# Patient Record
Sex: Female | Born: 1970 | Race: White | Hispanic: No | Marital: Married | Smoking: Former smoker
Health system: Southern US, Community
[De-identification: ages and names within clinical notes are randomized; demographics above are authoritative.]

## PROBLEM LIST (undated history)

## (undated) DIAGNOSIS — F308 Other manic episodes: Secondary | ICD-10-CM

## (undated) DIAGNOSIS — F1020 Alcohol dependence, uncomplicated: Secondary | ICD-10-CM

## (undated) DIAGNOSIS — F909 Attention-deficit hyperactivity disorder, unspecified type: Secondary | ICD-10-CM

## (undated) DIAGNOSIS — Z1509 Genetic susceptibility to other malignant neoplasm: Secondary | ICD-10-CM

## (undated) DIAGNOSIS — F329 Major depressive disorder, single episode, unspecified: Secondary | ICD-10-CM

## (undated) DIAGNOSIS — F419 Anxiety disorder, unspecified: Secondary | ICD-10-CM

## (undated) DIAGNOSIS — F32A Depression, unspecified: Secondary | ICD-10-CM

## (undated) HISTORY — DX: Genetic susceptibility to other malignant neoplasm: Z15.09

## (undated) HISTORY — DX: Other manic episodes: F30.8

---

## 2001-05-30 ENCOUNTER — Other Ambulatory Visit: Admission: RE | Admit: 2001-05-30 | Discharge: 2001-05-30 | Payer: Self-pay | Admitting: Obstetrics and Gynecology

## 2002-03-06 ENCOUNTER — Inpatient Hospital Stay (HOSPITAL_COMMUNITY): Admission: AD | Admit: 2002-03-06 | Discharge: 2002-03-09 | Payer: Self-pay | Admitting: Obstetrics & Gynecology

## 2002-07-23 ENCOUNTER — Other Ambulatory Visit: Admission: RE | Admit: 2002-07-23 | Discharge: 2002-07-23 | Payer: Self-pay | Admitting: Obstetrics & Gynecology

## 2003-05-26 ENCOUNTER — Inpatient Hospital Stay (HOSPITAL_COMMUNITY): Admission: EM | Admit: 2003-05-26 | Discharge: 2003-05-29 | Payer: Self-pay | Admitting: Psychiatry

## 2004-06-15 ENCOUNTER — Other Ambulatory Visit: Admission: RE | Admit: 2004-06-15 | Discharge: 2004-06-15 | Payer: Self-pay | Admitting: Obstetrics & Gynecology

## 2005-06-27 ENCOUNTER — Other Ambulatory Visit: Admission: RE | Admit: 2005-06-27 | Discharge: 2005-06-27 | Payer: Self-pay | Admitting: Obstetrics & Gynecology

## 2010-10-18 ENCOUNTER — Encounter: Payer: Self-pay | Admitting: Orthopedic Surgery

## 2011-04-19 ENCOUNTER — Encounter: Payer: Self-pay | Admitting: Sports Medicine

## 2011-04-20 ENCOUNTER — Encounter: Payer: Self-pay | Admitting: Sports Medicine

## 2012-09-05 ENCOUNTER — Other Ambulatory Visit: Payer: Self-pay | Admitting: Obstetrics & Gynecology

## 2012-09-05 DIAGNOSIS — N63 Unspecified lump in unspecified breast: Secondary | ICD-10-CM

## 2012-09-17 ENCOUNTER — Ambulatory Visit
Admission: RE | Admit: 2012-09-17 | Discharge: 2012-09-17 | Disposition: A | Discharge status: Home / Self Care | Payer: BC Managed Care – PPO | Source: Ambulatory Visit | Attending: Obstetrics & Gynecology | Admitting: Obstetrics & Gynecology

## 2012-09-17 DIAGNOSIS — N63 Unspecified lump in unspecified breast: Secondary | ICD-10-CM

## 2014-05-31 IMAGING — MG MM DIGITAL DIAGNOSTIC BILAT
5 series · 5 of 5 positions shown · non-contrast
Comparison: None

CLINICAL DATA: 41-year-old female for bilateral mammograms and
with palpable thickening in the outer right breast.

DIGITAL DIAGNOSTIC BILATERAL MAMMOGRAM WITH CAD AND RIGHT BREAST
ULTRASOUND:

[R CC]
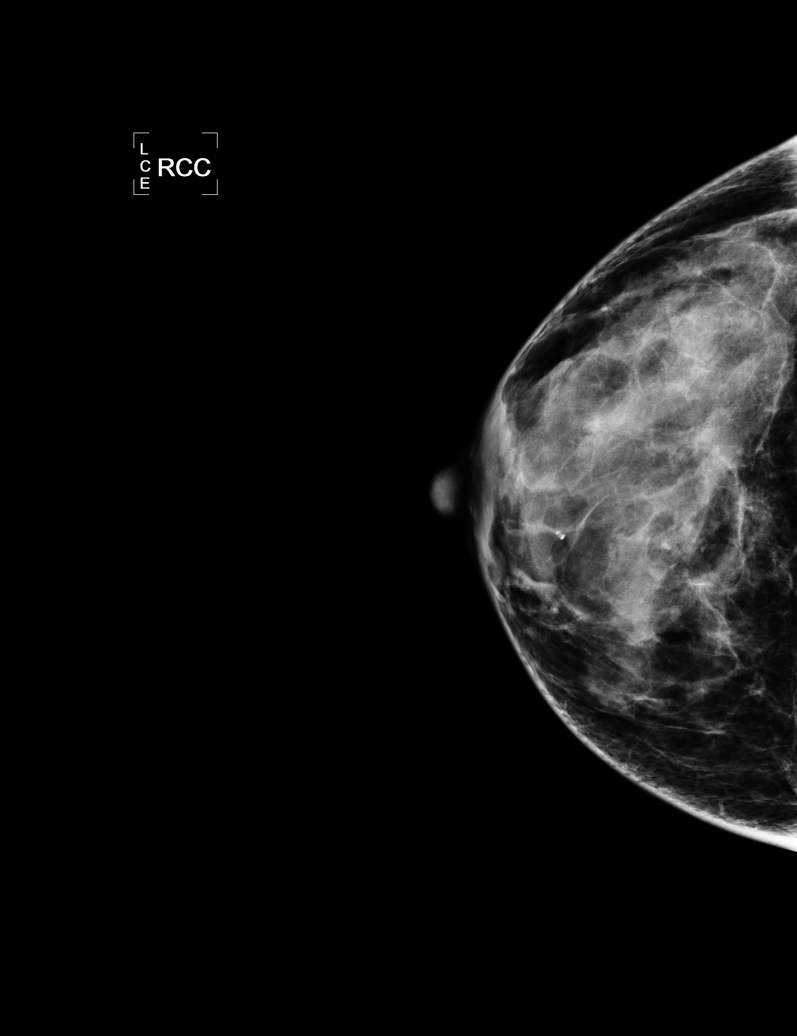

[L CC]
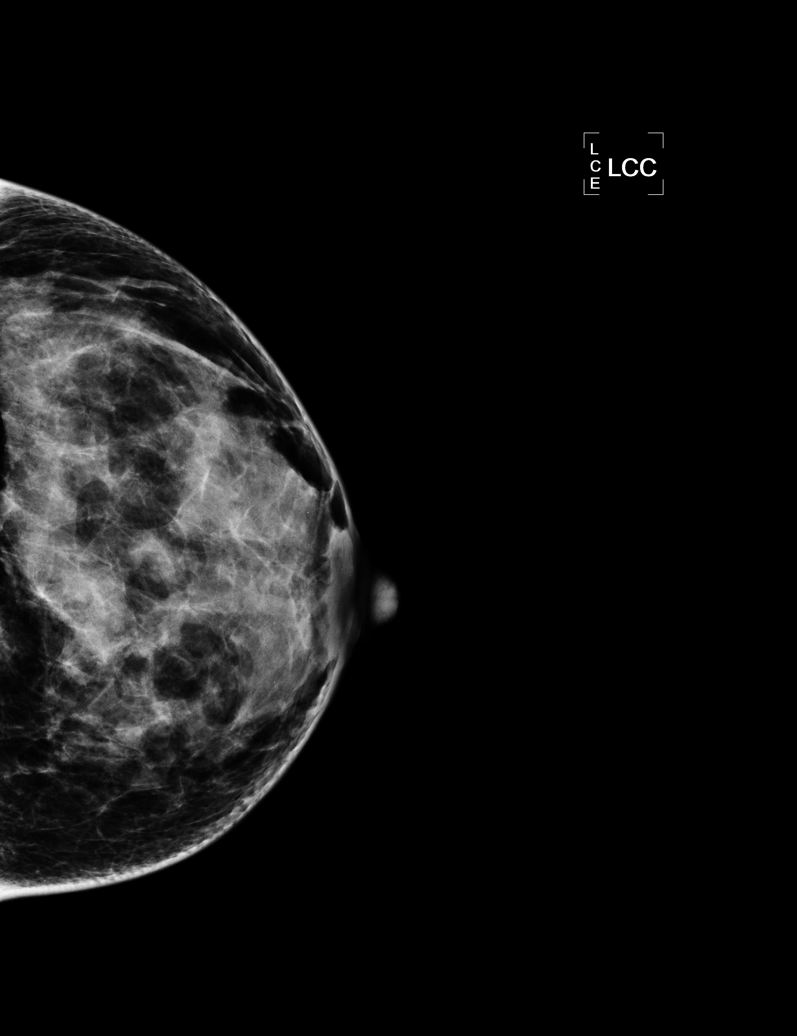

[L MLO (1 of 2)]
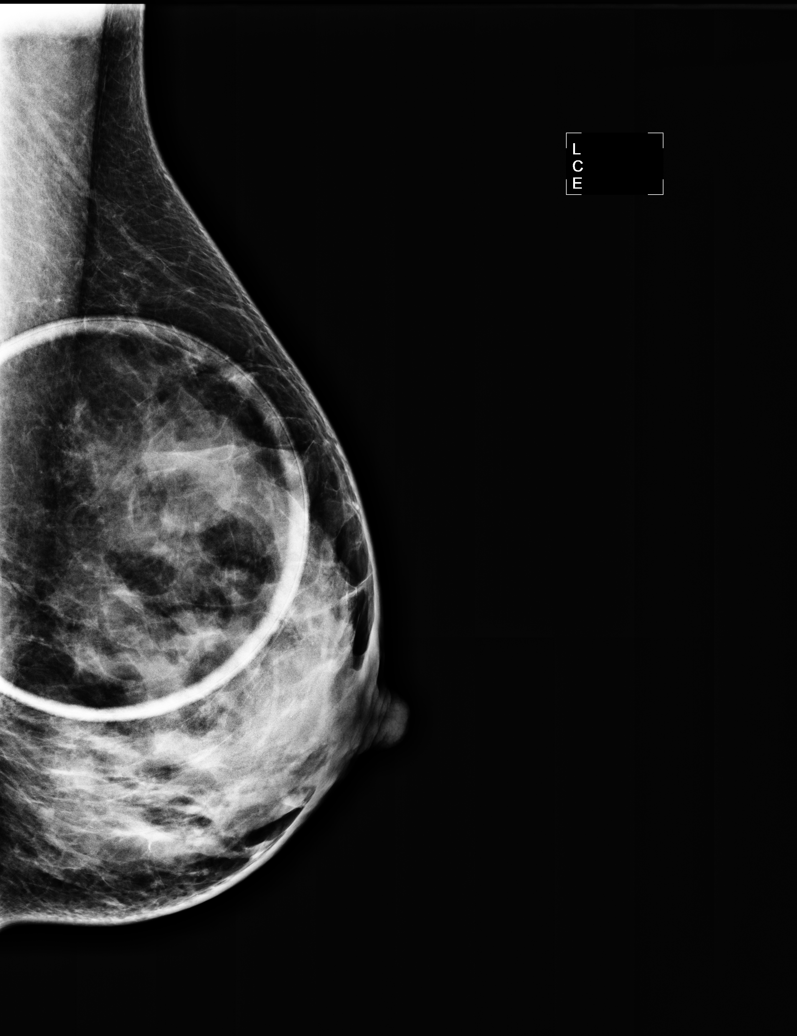

[L MLO (2 of 2)]
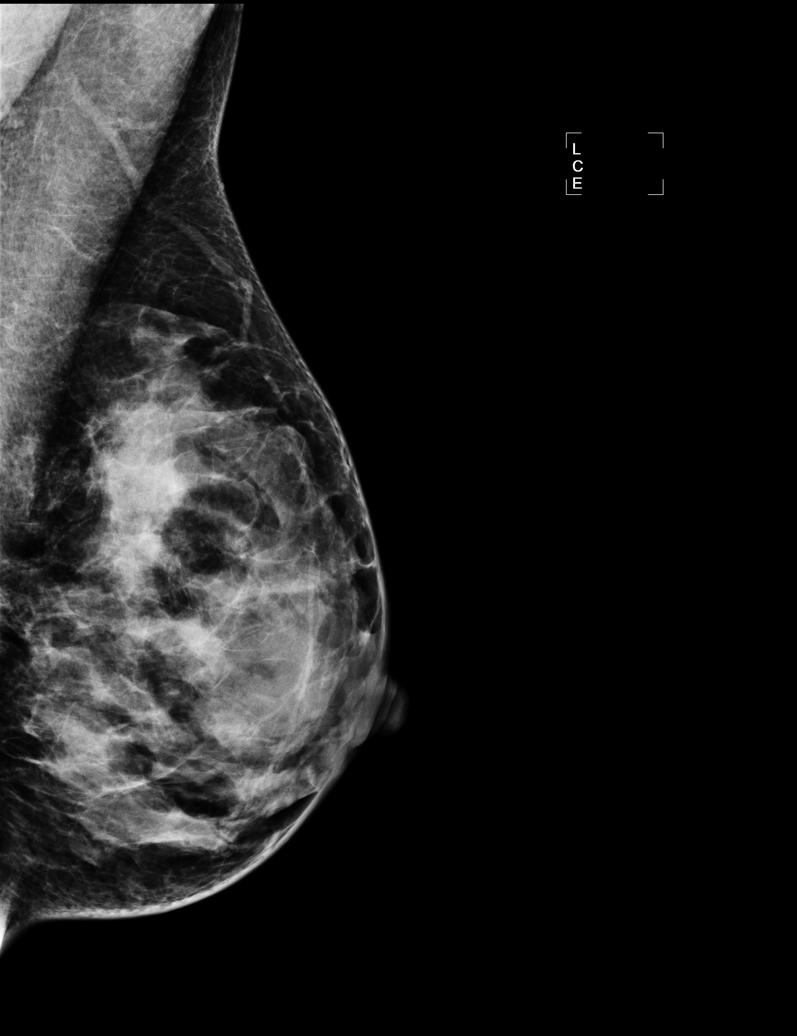

[R MLO]
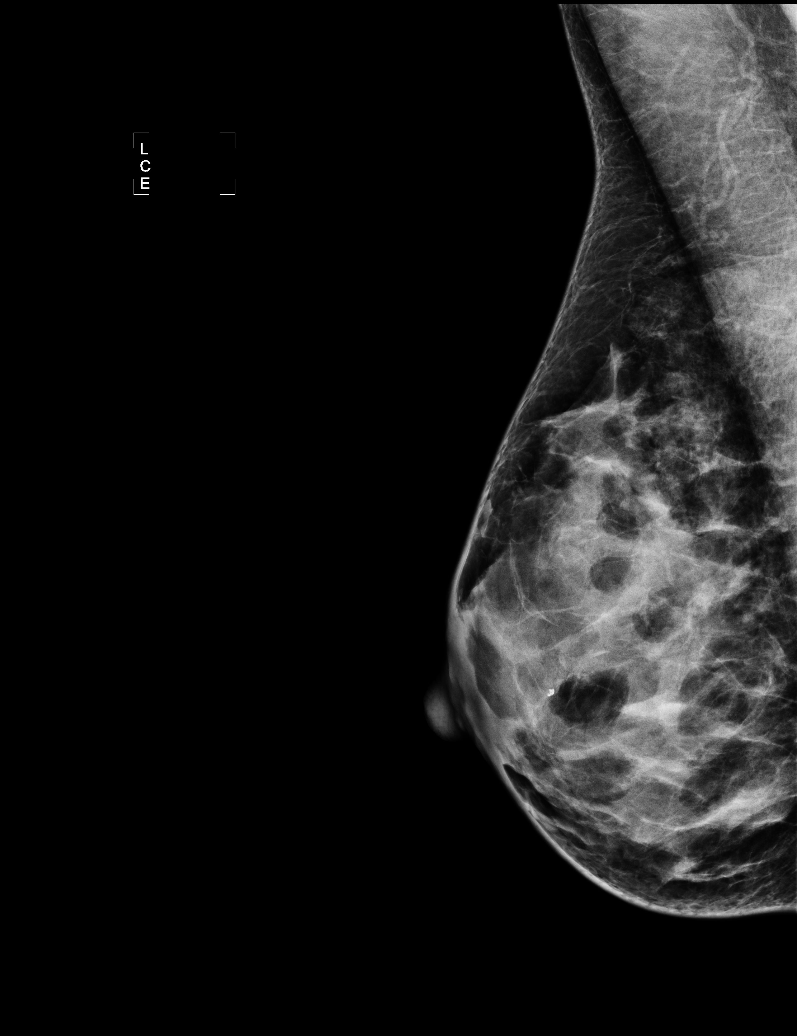

[5 of 5 positions shown; findings below may reference images not displayed]

FINDINGS: ACR Breast Density Category 3: The breast tissue is heterogeneously
dense.

A spot compression view of the left breast and routine views of
both breasts demonstrate no evidence of discrete mass, distortion
or worrisome calcifications bilaterally.

Mammographic images were processed with CAD.

On physical exam, mild thickening is identified within the outer
right breast, in the area of palpable concern.

Ultrasound is performed, showing no evidence of solid or cystic
mass, distortion or abnormal areas of shadowing within the entire
outer right breast.  Normal dense fibroglandular tissue is
identified in the area of palpable concern.
IMPRESSION: No mammographic, palpable or sonographic abnormality identified in
the area of palpable concern.

No mammographic evidence of breast malignancy bilaterally.

BI-RADS CATEGORY 1:  Negative.

RECOMMENDATION:
Bilateral screening mammograms in 1 year.

I discussed the findings and recommendations with the patient and
her questions answered.  She was encouraged to begin/continue
monthly self exams and to contact her primary physician if any
changes noted. A written report was given to the patient.

## 2015-05-25 ENCOUNTER — Ambulatory Visit: Payer: Self-pay

## 2016-02-10 DIAGNOSIS — F901 Attention-deficit hyperactivity disorder, predominantly hyperactive type: Secondary | ICD-10-CM | POA: Diagnosis not present

## 2016-02-10 DIAGNOSIS — F39 Unspecified mood [affective] disorder: Secondary | ICD-10-CM | POA: Diagnosis not present

## 2016-03-10 DIAGNOSIS — F411 Generalized anxiety disorder: Secondary | ICD-10-CM | POA: Diagnosis not present

## 2016-03-17 DIAGNOSIS — F411 Generalized anxiety disorder: Secondary | ICD-10-CM | POA: Diagnosis not present

## 2016-04-27 ENCOUNTER — Encounter (HOSPITAL_COMMUNITY): Payer: Self-pay

## 2016-04-27 ENCOUNTER — Emergency Department (HOSPITAL_COMMUNITY)
Admission: EM | Admit: 2016-04-27 | Discharge: 2016-04-27 | Disposition: A | Discharge status: Home / Self Care | Payer: BLUE CROSS/BLUE SHIELD | Attending: Emergency Medicine | Admitting: Emergency Medicine

## 2016-04-27 DIAGNOSIS — R05 Cough: Secondary | ICD-10-CM | POA: Diagnosis present

## 2016-04-27 DIAGNOSIS — R7401 Elevation of levels of liver transaminase levels: Secondary | ICD-10-CM | POA: Insufficient documentation

## 2016-04-27 DIAGNOSIS — F1310 Sedative, hypnotic or anxiolytic abuse, uncomplicated: Secondary | ICD-10-CM | POA: Insufficient documentation

## 2016-04-27 DIAGNOSIS — F329 Major depressive disorder, single episode, unspecified: Secondary | ICD-10-CM | POA: Insufficient documentation

## 2016-04-27 DIAGNOSIS — F909 Attention-deficit hyperactivity disorder, unspecified type: Secondary | ICD-10-CM | POA: Diagnosis not present

## 2016-04-27 DIAGNOSIS — Z87891 Personal history of nicotine dependence: Secondary | ICD-10-CM | POA: Diagnosis not present

## 2016-04-27 DIAGNOSIS — D7589 Other specified diseases of blood and blood-forming organs: Secondary | ICD-10-CM | POA: Insufficient documentation

## 2016-04-27 DIAGNOSIS — F1010 Alcohol abuse, uncomplicated: Secondary | ICD-10-CM

## 2016-04-27 DIAGNOSIS — R059 Cough, unspecified: Secondary | ICD-10-CM

## 2016-04-27 HISTORY — DX: Major depressive disorder, single episode, unspecified: F32.9

## 2016-04-27 HISTORY — DX: Depression, unspecified: F32.A

## 2016-04-27 HISTORY — DX: Anxiety disorder, unspecified: F41.9

## 2016-04-27 HISTORY — DX: Attention-deficit hyperactivity disorder, unspecified type: F90.9

## 2016-04-27 HISTORY — DX: Alcohol dependence, uncomplicated: F10.20

## 2016-04-27 MED ORDER — CHLORDIAZEPOXIDE HCL 25 MG PO CAPS: ORAL_CAPSULE | ORAL | 0 refills | Status: DC

## 2016-04-27 NOTE — Discharge Instructions (Signed)
Please read and follow all provided instructions.  Your diagnoses today include:  1. Alcohol abuse   2. Cough     Tests performed today include: Vital signs. See below for your results today.   Medications prescribed:  Librium - medication to prevent serious symptoms from alcohol withdrawal  Home care instructions:  Follow any educational materials contained in this packet.  Follow-up instructions: Please follow-up with your primary care provider as needed for further evaluation of your symptoms.  Return instructions:  Please return to the Emergency Department if you experience worsening symptoms.  Please return if you have any other emergent concerns.  Additional Information:  Your vital signs today were: BP 115/76    Pulse 87    Temp 98.3 F (36.8 C) (Oral)    Resp 16    LMP 04/27/2016    SpO2 100%  If your blood pressure (BP) was elevated above 135/85 this visit, please have this repeated by your doctor within one month. ---------------   Substance Abuse Treatment Programs  Intensive Outpatient Programs [facility]     [address]      [place], [place]                   [phone]       [facility] [address] [place] [address] [place], [place] [phone]  [facility]     (Inpatient and outpatient)     [address]           [phone]    [facility] [phone] (Suboxone and Methadone)  [address]      [place], [place] [address]      [phone]       [address] [place], [place] [phone]  [facility] (Outpatient/Inpatient, [facility])    (insurance only) [phone]             [facility] (Groups & Residential) [place], [place] [phone]     [facility]     [address]     [place], [place]      [phone]       [facility] (for caregivers and family) [phone] [place],  [place] [phone]      Residential Treatment Programs [facility]      [address] [place], [place] [address]  [phone]       [facility] [address] [place], [place] [address] [phone]  [facility] [place]        [phone]       [facility] [phone]  [facility]             [address]                                         [place], [place]                                                [phone] or [phone]                               [facility] [place] [address] [place]  [address] [place], [address] [phone]  [facility]    [address]      [place], [place]     [phone]       [facility] [address] [place], [place] [phone]  [facility]   [address] [place], [place] [address]     [phone]      Admissions: 8am-3pm M-F  [facility] ([facility]) [address] [place], [place] [phone]  [facility]: Residential Program [phone] Days)   [place], [place]      [phone] or [phone]     [facility]: [facility] [place], [place] (Walk in Hours over the weekend or by referral)  [facility] [address] [place], [place], [place] [address] [phone]  Crisis Mobile: [facility]:  [phone] (for crisis response 24 hours a day) [facility] Hotline:      7241132598 Outpatient Psychiatry and Counseling  [facility]: Mobile Crisis Management 24 hours:  [phone]  [facility] of [facility] [employer] sliding scale fee and walk in schedule: M-F 8am-12pm/1pm-3pm [address]  [place], [place] [address] [phone]  [facility] [address] [place], [place] [address] [phone]  [facility] ([facility] as [facility] [employer])- new patient walk-in appointments available Monday - Friday 8am -3pm.          [address] [place], [place] [address] [phone] or crisis line- [phone]  [facility]/ Intensive Outpatient Therapy Program [address] [place], [place] [address] [phone]  [facility]                  Crisis Services      [phone] [address]     [place], [place] [address]                 [facility]   [facility] [phone]. [address] [place], [place] [address]   [employer] of Care          [address]  [place], [place] [address]       [phone]  [facility] [address] [place], [place] [address] [phone]  [facility]    [address]    [place], [place] [address]     [phone]       Andee Poles, MD     [address]     [place] [address]     [phone]       [facility] [address] [place] [address]  [facility]     [address] [place], [place]      [phone]       [facility] Eulogio Ditch, MD [address] [place], [place] [address] [phone]  [facility]     [address]     [place], [place] [address]     [phone]       [facility] [address] [place], [place] [address] [phone] Resources for Temporary Residential Assistance/Crisis Centers  [facility] CENT[facility]) M-F 8am-3pm   [address] [place], [place] [address]   [phone] Services include: laundry, barbering, support groups, case management, phone  & computer access, showers, AA/NA  mtgs, mental health/substance abuse nurse, job skills class, disability information, VA assistance, spiritual classes, etc.   HOMELESS SHELTERS  [facility]     [facility]   [address], [place]      [phone]              [employer] (women and children)       [address] [place], [place] [address] [phone] [email or web address] for application and process Application Required  [facility]   [address]    [place] [address]     [phone]                    [facility] [place] [address] [place], [place] [address] [phone] application appt.) Application Required  [facility] (women only)    [address]     [place], [place] [address]     [phone]      Intake starts 6pm daily Need valid ID, SSC, & Police report [employer] [address] [place], [place] [phone] Application Required  [employer] (men only)     [address]      [place], [place]     [phone]       [facility] [place] (Pregnant women only) [address] [place], [place] [phone]  [facility]      [address]      [place], [place] [address]     [phone]             [facility] [address] [place], [place] [phone] 90 day commitment/SA/Application process  [facility](men only)     [address]     [place], [place]     [phone]       Check-in at [facility] of [facility] [address] [place], [place] [address] [phone] Men/Women/Women and Children must be there by 7 pm  [facility] [place], [place] [phone]

## 2016-04-27 NOTE — ED Provider Notes (Signed)
[facility]-EMERGENCY DEPT Provider Note   CSN: [id number] Arrival date & time: 04/27/16  1115  First Provider Contact:  First MD Initiated Contact with Patient 04/27/16 1142        History   Chief Complaint Chief Complaint  Patient presents with  . Cough  . ETOH Detox    HPI CATISHA CURCURU is a 45 y.o. female.  Patient presents with primary complaint of alcohol detox. Patient states that she stopped drinking yesterday and is requesting help staying off of alcohol. She has had a history of withdrawal seizures in the past, and no recent seizures. She took a benzodiazepine this morning prior to arrival. No other complaints regarding withdrawals. Patient also mentions a cough for the past 10 days. Cough is productive of yellow sputum. It is intermittent. Husband recently had bronchitis. No difficulty breathing, shortness of breath, or chest pain. No associated fevers. No treatments prior to arrival for this.      Past Medical History:  Diagnosis Date  . ADHD (attention deficit hyperactivity disorder)   . Alcoholism (HCC)   . Anxiety   . Depression     There are no active problems to display for this patient.   Past Surgical History:  Procedure Laterality Date  . CESAREAN SECTION      OB History    No data available       Home Medications    Prior to Admission medications   Medication Sig Start Date End Date Taking? Authorizing Provider  chlordiazePOXIDE (LIBRIUM) 25 MG capsule 50mg  PO TID x 1D, then 25-50mg  PO BID X 1D, then 25-50mg  PO QD X 1D 04/27/16   Renne Crigler, PA-C    Family History History reviewed. No pertinent family history.  Social History Social History  Substance Use Topics  . Smoking status: Former Games developer  . Smokeless tobacco: Never Used     Comment: Pt wears nicotine patch daily  . Alcohol use Yes     Allergies   Review of patient's allergies indicates not on file.   Review of Systems Review of Systems  Constitutional: Negative for  fever.  HENT: Negative for rhinorrhea and sore throat.   Eyes: Negative for redness.  Respiratory: Positive for cough.   Cardiovascular: Negative for chest pain.  Gastrointestinal: Negative for abdominal pain, diarrhea, nausea and vomiting.  Genitourinary: Negative for dysuria.  Musculoskeletal: Negative for myalgias.  Skin: Negative for rash.  Neurological: Negative for seizures and headaches.  Psychiatric/Behavioral: Negative for agitation, behavioral problems, self-injury and suicidal ideas. The patient is nervous/anxious. The patient is not hyperactive.      Physical Exam Updated Vital Signs BP 115/76   Pulse 87   Temp 98.3 F (36.8 C) (Oral)   Resp 16   LMP 04/27/2016   SpO2 100%   Physical Exam  Constitutional: She appears well-developed and well-nourished.  HENT:  Head: Normocephalic and atraumatic.  Eyes: Conjunctivae are normal. Right eye exhibits no discharge. Left eye exhibits no discharge.  Neck: Normal range of motion. Neck supple.  Cardiovascular: Normal rate, regular rhythm and normal heart sounds.   Pulmonary/Chest: Effort normal and breath sounds normal. No respiratory distress. She has no wheezes. She has no rales.  Abdominal: Soft. There is no tenderness.  Neurological: She is alert.  Skin: Skin is warm and dry.  Psychiatric: She has a normal mood and affect.  Nursing note and vitals reviewed.    ED Treatments / Results  Labs (all labs ordered are listed, but only abnormal results are  displayed) Labs Reviewed - No data to display  EKG  EKG Interpretation None       Radiology No results found.  Procedures Procedures (including critical care time)  Medications Ordered in ED Medications - No data to display   Initial Impression / Assessment and Plan / ED Course  I have reviewed the triage vital signs and the nursing notes.  Pertinent labs & imaging results that were available during my care of the patient were reviewed by me and  considered in my medical decision making (see chart for details).  Clinical Course   Patient seen and examined. X-ray ordered. Discussed procedure for outpatient referrals and Librium. Encourage patient to follow-up with outpatient referrals for possible inpatient versus outpatient treatment. Chest x-ray ordered.  Vital signs reviewed and are as follows: Vitals:   04/27/16 1140 04/27/16 1205  BP: 120/80 115/76  Pulse: 74 87  Resp:  16  Temp:     12:39 PM Patient now refusing chest x-ray, requesting to leave. Will proceed with alcohol detox treatment as above.  Patient urged to return with worsening symptoms or other concerns. Patient verbalized understanding and agrees with plan.    Final Clinical Impressions(s) / ED Diagnoses   Final diagnoses:  Alcohol abuse  Cough    New Prescriptions New Prescriptions   CHLORDIAZEPOXIDE (LIBRIUM) 25 MG CAPSULE     PO TID x 1D, then 25-50mg  PO BID X 1D, then 25-50mg  PO QD X 1D     Renne Crigler, PA-C 04/27/16 1239    Azalia Bilis, MD 04/29/16 [phone]

## 2016-04-27 NOTE — ED Notes (Signed)
Pt requesting to leave.  PA made aware.

## 2016-04-27 NOTE — ED Triage Notes (Addendum)
Pt c/o productive cough x 10 days and ETOH detox.  Denies pain. Pt reports yellow sputum.  Pt has not been taking anything for cough.  Sts last drink around 0100.  Pt reports taking benzo prior to arrival.  Sts she would also like to "get off" her ADHD medication.  Unknown if benzo is prescribed to the Pt.     Dry cough noted during Triage.  Lungs sounds clear.

## 2016-05-11 DIAGNOSIS — R05 Cough: Secondary | ICD-10-CM | POA: Diagnosis not present

## 2016-05-11 DIAGNOSIS — F39 Unspecified mood [affective] disorder: Secondary | ICD-10-CM | POA: Diagnosis not present

## 2016-05-11 DIAGNOSIS — F901 Attention-deficit hyperactivity disorder, predominantly hyperactive type: Secondary | ICD-10-CM | POA: Diagnosis not present

## 2016-06-16 DIAGNOSIS — Z0000 Encounter for general adult medical examination without abnormal findings: Secondary | ICD-10-CM | POA: Diagnosis not present

## 2016-07-14 DIAGNOSIS — M50222 Other cervical disc displacement at C5-C6 level: Secondary | ICD-10-CM | POA: Diagnosis not present

## 2016-08-17 DIAGNOSIS — M542 Cervicalgia: Secondary | ICD-10-CM | POA: Diagnosis not present

## 2016-08-17 DIAGNOSIS — M461 Sacroiliitis, not elsewhere classified: Secondary | ICD-10-CM | POA: Diagnosis not present

## 2016-08-17 DIAGNOSIS — M545 Low back pain: Secondary | ICD-10-CM | POA: Diagnosis not present

## 2016-09-12 DIAGNOSIS — M542 Cervicalgia: Secondary | ICD-10-CM | POA: Diagnosis not present

## 2016-09-12 DIAGNOSIS — M533 Sacrococcygeal disorders, not elsewhere classified: Secondary | ICD-10-CM | POA: Diagnosis not present

## 2016-09-12 DIAGNOSIS — M791 Myalgia: Secondary | ICD-10-CM | POA: Diagnosis not present

## 2016-10-19 DIAGNOSIS — F39 Unspecified mood [affective] disorder: Secondary | ICD-10-CM | POA: Diagnosis not present

## 2016-10-19 DIAGNOSIS — F901 Attention-deficit hyperactivity disorder, predominantly hyperactive type: Secondary | ICD-10-CM | POA: Diagnosis not present

## 2016-10-19 DIAGNOSIS — F1010 Alcohol abuse, uncomplicated: Secondary | ICD-10-CM | POA: Diagnosis not present

## 2016-11-07 DIAGNOSIS — M5481 Occipital neuralgia: Secondary | ICD-10-CM | POA: Diagnosis not present

## 2016-11-07 DIAGNOSIS — M533 Sacrococcygeal disorders, not elsewhere classified: Secondary | ICD-10-CM | POA: Diagnosis not present

## 2016-11-07 DIAGNOSIS — M791 Myalgia: Secondary | ICD-10-CM | POA: Diagnosis not present

## 2016-11-07 DIAGNOSIS — M542 Cervicalgia: Secondary | ICD-10-CM | POA: Diagnosis not present

## 2016-11-09 DIAGNOSIS — Z3200 Encounter for pregnancy test, result unknown: Secondary | ICD-10-CM | POA: Diagnosis not present

## 2016-11-09 DIAGNOSIS — Z30430 Encounter for insertion of intrauterine contraceptive device: Secondary | ICD-10-CM | POA: Diagnosis not present

## 2016-11-24 DIAGNOSIS — N899 Noninflammatory disorder of vagina, unspecified: Secondary | ICD-10-CM | POA: Diagnosis not present

## 2016-11-24 DIAGNOSIS — T192XXA Foreign body in vulva and vagina, initial encounter: Secondary | ICD-10-CM | POA: Diagnosis not present

## 2016-11-25 DIAGNOSIS — M461 Sacroiliitis, not elsewhere classified: Secondary | ICD-10-CM | POA: Diagnosis not present

## 2016-11-25 DIAGNOSIS — M545 Low back pain: Secondary | ICD-10-CM | POA: Diagnosis not present

## 2016-11-25 DIAGNOSIS — M542 Cervicalgia: Secondary | ICD-10-CM | POA: Diagnosis not present

## 2016-11-30 DIAGNOSIS — Z1151 Encounter for screening for human papillomavirus (HPV): Secondary | ICD-10-CM | POA: Diagnosis not present

## 2016-11-30 DIAGNOSIS — Z01419 Encounter for gynecological examination (general) (routine) without abnormal findings: Secondary | ICD-10-CM | POA: Diagnosis not present

## 2016-11-30 DIAGNOSIS — Z6822 Body mass index (BMI) 22.0-22.9, adult: Secondary | ICD-10-CM | POA: Diagnosis not present

## 2016-11-30 DIAGNOSIS — Z1231 Encounter for screening mammogram for malignant neoplasm of breast: Secondary | ICD-10-CM | POA: Diagnosis not present

## 2016-12-08 DIAGNOSIS — D2272 Melanocytic nevi of left lower limb, including hip: Secondary | ICD-10-CM | POA: Diagnosis not present

## 2016-12-08 DIAGNOSIS — D2371 Other benign neoplasm of skin of right lower limb, including hip: Secondary | ICD-10-CM | POA: Diagnosis not present

## 2016-12-08 DIAGNOSIS — D225 Melanocytic nevi of trunk: Secondary | ICD-10-CM | POA: Diagnosis not present

## 2016-12-08 DIAGNOSIS — L700 Acne vulgaris: Secondary | ICD-10-CM | POA: Diagnosis not present

## 2016-12-15 DIAGNOSIS — M542 Cervicalgia: Secondary | ICD-10-CM | POA: Diagnosis not present

## 2016-12-15 DIAGNOSIS — M5481 Occipital neuralgia: Secondary | ICD-10-CM | POA: Diagnosis not present

## 2016-12-15 DIAGNOSIS — M791 Myalgia: Secondary | ICD-10-CM | POA: Diagnosis not present

## 2016-12-15 DIAGNOSIS — M533 Sacrococcygeal disorders, not elsewhere classified: Secondary | ICD-10-CM | POA: Diagnosis not present

## 2016-12-21 DIAGNOSIS — J302 Other seasonal allergic rhinitis: Secondary | ICD-10-CM | POA: Diagnosis not present

## 2016-12-21 DIAGNOSIS — M792 Neuralgia and neuritis, unspecified: Secondary | ICD-10-CM | POA: Diagnosis not present

## 2016-12-21 DIAGNOSIS — F909 Attention-deficit hyperactivity disorder, unspecified type: Secondary | ICD-10-CM | POA: Diagnosis not present

## 2016-12-21 DIAGNOSIS — F1010 Alcohol abuse, uncomplicated: Secondary | ICD-10-CM | POA: Diagnosis not present

## 2017-01-19 DIAGNOSIS — F39 Unspecified mood [affective] disorder: Secondary | ICD-10-CM | POA: Diagnosis not present

## 2017-01-19 DIAGNOSIS — F1010 Alcohol abuse, uncomplicated: Secondary | ICD-10-CM | POA: Diagnosis not present

## 2017-01-19 DIAGNOSIS — F901 Attention-deficit hyperactivity disorder, predominantly hyperactive type: Secondary | ICD-10-CM | POA: Diagnosis not present

## 2017-02-14 DIAGNOSIS — M542 Cervicalgia: Secondary | ICD-10-CM | POA: Diagnosis not present

## 2017-02-14 DIAGNOSIS — M5481 Occipital neuralgia: Secondary | ICD-10-CM | POA: Diagnosis not present

## 2017-02-14 DIAGNOSIS — M791 Myalgia: Secondary | ICD-10-CM | POA: Diagnosis not present

## 2017-02-14 DIAGNOSIS — M533 Sacrococcygeal disorders, not elsewhere classified: Secondary | ICD-10-CM | POA: Diagnosis not present

## 2017-03-15 DIAGNOSIS — R300 Dysuria: Secondary | ICD-10-CM | POA: Diagnosis not present

## 2017-03-15 DIAGNOSIS — R358 Other polyuria: Secondary | ICD-10-CM | POA: Diagnosis not present

## 2017-03-15 DIAGNOSIS — R8290 Unspecified abnormal findings in urine: Secondary | ICD-10-CM | POA: Diagnosis not present

## 2017-05-04 DIAGNOSIS — F310 Bipolar disorder, current episode hypomanic: Secondary | ICD-10-CM | POA: Diagnosis not present

## 2017-05-10 DIAGNOSIS — F331 Major depressive disorder, recurrent, moderate: Secondary | ICD-10-CM | POA: Diagnosis not present

## 2017-05-10 DIAGNOSIS — F900 Attention-deficit hyperactivity disorder, predominantly inattentive type: Secondary | ICD-10-CM | POA: Diagnosis not present

## 2017-05-10 DIAGNOSIS — F411 Generalized anxiety disorder: Secondary | ICD-10-CM | POA: Diagnosis not present

## 2017-06-28 DIAGNOSIS — F900 Attention-deficit hyperactivity disorder, predominantly inattentive type: Secondary | ICD-10-CM | POA: Diagnosis not present

## 2017-08-30 DIAGNOSIS — F900 Attention-deficit hyperactivity disorder, predominantly inattentive type: Secondary | ICD-10-CM | POA: Diagnosis not present

## 2017-10-25 DIAGNOSIS — F411 Generalized anxiety disorder: Secondary | ICD-10-CM | POA: Diagnosis not present

## 2017-10-25 DIAGNOSIS — F900 Attention-deficit hyperactivity disorder, predominantly inattentive type: Secondary | ICD-10-CM | POA: Diagnosis not present

## 2017-10-25 DIAGNOSIS — F331 Major depressive disorder, recurrent, moderate: Secondary | ICD-10-CM | POA: Diagnosis not present

## 2017-11-28 DIAGNOSIS — F331 Major depressive disorder, recurrent, moderate: Secondary | ICD-10-CM | POA: Diagnosis not present

## 2017-11-28 DIAGNOSIS — F411 Generalized anxiety disorder: Secondary | ICD-10-CM | POA: Diagnosis not present

## 2017-11-28 DIAGNOSIS — F900 Attention-deficit hyperactivity disorder, predominantly inattentive type: Secondary | ICD-10-CM | POA: Diagnosis not present

## 2017-12-20 DIAGNOSIS — Z0000 Encounter for general adult medical examination without abnormal findings: Secondary | ICD-10-CM | POA: Diagnosis not present

## 2017-12-20 DIAGNOSIS — R82998 Other abnormal findings in urine: Secondary | ICD-10-CM | POA: Diagnosis not present

## 2017-12-20 DIAGNOSIS — R5381 Other malaise: Secondary | ICD-10-CM | POA: Diagnosis not present

## 2017-12-26 DIAGNOSIS — F331 Major depressive disorder, recurrent, moderate: Secondary | ICD-10-CM | POA: Diagnosis not present

## 2017-12-26 DIAGNOSIS — F900 Attention-deficit hyperactivity disorder, predominantly inattentive type: Secondary | ICD-10-CM | POA: Diagnosis not present

## 2017-12-26 DIAGNOSIS — F411 Generalized anxiety disorder: Secondary | ICD-10-CM | POA: Diagnosis not present

## 2017-12-27 DIAGNOSIS — Z800 Family history of malignant neoplasm of digestive organs: Secondary | ICD-10-CM | POA: Diagnosis not present

## 2017-12-27 DIAGNOSIS — F308 Other manic episodes: Secondary | ICD-10-CM | POA: Diagnosis not present

## 2017-12-27 DIAGNOSIS — F909 Attention-deficit hyperactivity disorder, unspecified type: Secondary | ICD-10-CM | POA: Diagnosis not present

## 2017-12-27 DIAGNOSIS — Z1389 Encounter for screening for other disorder: Secondary | ICD-10-CM | POA: Diagnosis not present

## 2017-12-27 DIAGNOSIS — Z0000 Encounter for general adult medical examination without abnormal findings: Secondary | ICD-10-CM | POA: Diagnosis not present

## 2017-12-27 DIAGNOSIS — F1010 Alcohol abuse, uncomplicated: Secondary | ICD-10-CM | POA: Diagnosis not present

## 2017-12-28 ENCOUNTER — Other Ambulatory Visit: Payer: Self-pay | Admitting: Internal Medicine

## 2017-12-28 DIAGNOSIS — Z800 Family history of malignant neoplasm of digestive organs: Secondary | ICD-10-CM

## 2017-12-28 DIAGNOSIS — Z8049 Family history of malignant neoplasm of other genital organs: Secondary | ICD-10-CM

## 2017-12-28 DIAGNOSIS — Z803 Family history of malignant neoplasm of breast: Secondary | ICD-10-CM

## 2018-01-03 DIAGNOSIS — F411 Generalized anxiety disorder: Secondary | ICD-10-CM | POA: Diagnosis not present

## 2018-01-03 DIAGNOSIS — F331 Major depressive disorder, recurrent, moderate: Secondary | ICD-10-CM | POA: Diagnosis not present

## 2018-01-03 DIAGNOSIS — F900 Attention-deficit hyperactivity disorder, predominantly inattentive type: Secondary | ICD-10-CM | POA: Diagnosis not present

## 2018-01-08 ENCOUNTER — Encounter: Payer: Self-pay | Admitting: Gastroenterology

## 2018-01-11 ENCOUNTER — Ambulatory Visit
Admission: RE | Admit: 2018-01-11 | Discharge: 2018-01-11 | Disposition: A | Discharge status: Home / Self Care | Payer: BLUE CROSS/BLUE SHIELD | Source: Ambulatory Visit | Attending: Internal Medicine | Admitting: Internal Medicine

## 2018-01-11 DIAGNOSIS — Z8049 Family history of malignant neoplasm of other genital organs: Secondary | ICD-10-CM | POA: Diagnosis not present

## 2018-01-11 DIAGNOSIS — Z803 Family history of malignant neoplasm of breast: Secondary | ICD-10-CM

## 2018-01-11 DIAGNOSIS — Z800 Family history of malignant neoplasm of digestive organs: Secondary | ICD-10-CM

## 2018-01-17 DIAGNOSIS — Z1231 Encounter for screening mammogram for malignant neoplasm of breast: Secondary | ICD-10-CM | POA: Diagnosis not present

## 2018-01-17 DIAGNOSIS — Z6821 Body mass index (BMI) 21.0-21.9, adult: Secondary | ICD-10-CM | POA: Diagnosis not present

## 2018-01-17 DIAGNOSIS — Z01419 Encounter for gynecological examination (general) (routine) without abnormal findings: Secondary | ICD-10-CM | POA: Diagnosis not present

## 2018-01-22 DIAGNOSIS — F1010 Alcohol abuse, uncomplicated: Secondary | ICD-10-CM | POA: Diagnosis not present

## 2018-01-22 DIAGNOSIS — F900 Attention-deficit hyperactivity disorder, predominantly inattentive type: Secondary | ICD-10-CM | POA: Diagnosis not present

## 2018-01-22 DIAGNOSIS — F411 Generalized anxiety disorder: Secondary | ICD-10-CM | POA: Diagnosis not present

## 2018-01-22 DIAGNOSIS — F331 Major depressive disorder, recurrent, moderate: Secondary | ICD-10-CM | POA: Diagnosis not present

## 2018-02-05 DIAGNOSIS — Z79899 Other long term (current) drug therapy: Secondary | ICD-10-CM | POA: Diagnosis not present

## 2018-02-05 DIAGNOSIS — R41840 Attention and concentration deficit: Secondary | ICD-10-CM | POA: Diagnosis not present

## 2018-02-20 DIAGNOSIS — Z1509 Genetic susceptibility to other malignant neoplasm: Secondary | ICD-10-CM | POA: Diagnosis not present

## 2018-02-22 ENCOUNTER — Ambulatory Visit: Payer: BLUE CROSS/BLUE SHIELD | Admitting: Gastroenterology

## 2018-03-02 ENCOUNTER — Other Ambulatory Visit: Payer: Self-pay | Admitting: Obstetrics and Gynecology

## 2018-03-05 DIAGNOSIS — F902 Attention-deficit hyperactivity disorder, combined type: Secondary | ICD-10-CM | POA: Diagnosis not present

## 2018-03-05 DIAGNOSIS — Z79899 Other long term (current) drug therapy: Secondary | ICD-10-CM | POA: Diagnosis not present

## 2018-03-12 DIAGNOSIS — F411 Generalized anxiety disorder: Secondary | ICD-10-CM | POA: Diagnosis not present

## 2018-03-12 DIAGNOSIS — F900 Attention-deficit hyperactivity disorder, predominantly inattentive type: Secondary | ICD-10-CM | POA: Diagnosis not present

## 2018-03-12 DIAGNOSIS — F331 Major depressive disorder, recurrent, moderate: Secondary | ICD-10-CM | POA: Diagnosis not present

## 2018-03-13 NOTE — Patient Instructions (Addendum)
Nicole ScottLaura J Huang  03/13/2018   Your procedure is scheduled on: 03-21-18  [facility]    Report to Admitting at 11:00 AM    Call this number if you have problems the morning of surgery [phone]   Remember: Do not eat food or drink liquids :After Midnight. You may have a Clear Liquid Diet from Midnight until 7:00 AM. After 7:00 AM, nothing until after surgery.     CLEAR LIQUID DIET   Foods Allowed                                                                     Foods Excluded  Coffee and tea, regular and decaf                             liquids that you cannot  Plain Jell-O in any flavor                                             see through such as: Fruit ices (not with fruit pulp)                                     milk, soups, orange juice  Iced Popsicles                                    All solid food Carbonated beverages, regular and diet                                    Cranberry, grape and apple juices Sports drinks like Gatorade Lightly seasoned clear broth or consume(fat free) Sugar, honey syrup  Sample Menu Breakfast                                Lunch                                     Supper Cranberry juice                    Beef broth                            Chicken broth Jell-O                                     Grape juice  Apple juice Coffee or tea                        Jell-O                                      Popsicle                                                Coffee or tea                        Coffee or tea  _____________________________________________________________________     Take these medicines the morning of surgery with A SIP OF WATER: (Amphetamine-Dextroamphetamine) Adderall XR,  Bupropion (Wellbutrin-XL),  Buspirone (Buspar), and Gabapentin (Neurontin), and Mydayis                                You may not have any metal on your body including  hair pins and              piercings  Do not wear jewelry, make-up, lotions, powders or perfumes, deodorant             Do not wear nail polish.  Do not shave  48 hours prior to surgery.            Do not bring valuables to the hospital. [place] IS NOT             RESPONSIBLE   FOR VALUABLES.  Contacts, dentures or bridgework may not be worn into surgery.  Leave suitcase in the car. After surgery it may be brought to your room.      Special Instructions: N/A              Please read over the following fact sheets you were given: _____________________________________________________________________          [facility] - Preparing for Surgery Before surgery, you can play an important role.  Because skin is not sterile, your skin needs to be as free of germs as possible.  You can reduce the number of germs on your skin by washing with CHG (chlorahexidine gluconate) soap before surgery.  CHG is an antiseptic cleaner which kills germs and bonds with the skin to continue killing germs even after washing. Please DO NOT use if you have an allergy to CHG or antibacterial soaps.  If your skin becomes reddened/irritated stop using the CHG and inform your nurse when you arrive at Short Stay. Do not shave (including legs and underarms) for at least 48 hours prior to the first CHG shower.  You may shave your face/neck. Please follow these instructions carefully:  1.  Shower with CHG Soap the night before surgery and the  morning of Surgery.  2.  If you choose to wash your hair, wash your hair first as usual with your  normal  shampoo.  3.  After you shampoo, rinse your hair and body thoroughly to remove the  shampoo.                           4.  Use CHG as  you would any other liquid soap.  You can apply chg directly  to the skin and wash                       Gently with a scrungie or clean washcloth.  5.  Apply the CHG Soap to your body ONLY FROM THE NECK DOWN.   Do not use on face/ open                            Wound or open sores. Avoid contact with eyes, ears mouth and genitals (private parts).                       Wash face,  Genitals (private parts) with your normal soap.             6.  Wash thoroughly, paying special attention to the area where your surgery  will be performed.  7.  Thoroughly rinse your body with warm water from the neck down.  8.  DO NOT shower/wash with your normal soap after using and rinsing off  the CHG Soap.                9.  Pat yourself dry with a clean towel.            10.  Wear clean pajamas.            11.  Place clean sheets on your bed the night of your first shower and do not  sleep with pets. Day of Surgery : Do not apply any lotions/deodorants the morning of surgery.  Please wear clean clothes to the hospital/surgery center.  FAILURE TO FOLLOW THESE INSTRUCTIONS MAY RESULT IN THE CANCELLATION OF YOUR SURGERY PATIENT SIGNATURE_________________________________  NURSE SIGNATURE__________________________________  ________________________________________________________________________  WHAT IS A BLOOD TRANSFUSION? Blood Transfusion Information  A transfusion is the replacement of blood or some of its parts. Blood is made up of multiple cells which provide different functions.  Red blood cells carry oxygen and are used for blood loss replacement.  White blood cells fight against infection.  Platelets control bleeding.  Plasma helps clot blood.  Other blood products are available for specialized needs, such as hemophilia or other clotting disorders. BEFORE THE TRANSFUSION  Who gives blood for transfusions?   Healthy volunteers who are fully evaluated to make sure their blood is safe. This is blood bank blood. Transfusion therapy is the safest it has ever been in the practice of medicine. Before blood is taken from a donor, a complete history is taken to make sure that person has no history of diseases nor engages in risky social behavior  (examples are intravenous drug use or sexual activity with multiple partners). The donor's travel history is screened to minimize risk of transmitting infections, such as malaria. The donated blood is tested for signs of infectious diseases, such as HIV and hepatitis. The blood is then tested to be sure it is compatible with you in order to minimize the chance of a transfusion reaction. If you or a relative donates blood, this is often done in anticipation of surgery and is not appropriate for emergency situations. It takes many days to process the donated blood. RISKS AND COMPLICATIONS Although transfusion therapy is very safe and saves many lives, the main dangers of transfusion include:   Getting an infectious disease.  Developing a transfusion reaction. This is an allergic reaction  to something in the blood you were given. Every precaution is taken to prevent this. The decision to have a blood transfusion has been considered carefully by your caregiver before blood is given. Blood is not given unless the benefits outweigh the risks. AFTER THE TRANSFUSION  Right after receiving a blood transfusion, you will usually feel much better and more energetic. This is especially true if your red blood cells have gotten low (anemic). The transfusion raises the level of the red blood cells which carry oxygen, and this usually causes an energy increase.  The nurse administering the transfusion will monitor you carefully for complications. HOME CARE INSTRUCTIONS  No special instructions are needed after a transfusion. You may find your energy is better. Speak with your caregiver about any limitations on activity for underlying diseases you may have. SEEK MEDICAL CARE IF:   Your condition is not improving after your transfusion.  You develop redness or irritation at the intravenous (IV) site. SEEK IMMEDIATE MEDICAL CARE IF:  Any of the following symptoms occur over the next 12 hours:  Shaking  chills.  You have a temperature by mouth above 102 F (38.9 C), not controlled by medicine.  Chest, back, or muscle pain.  People around you feel you are not acting correctly or are confused.  Shortness of breath or difficulty breathing.  Dizziness and fainting.  You get a rash or develop hives.  You have a decrease in urine output.  Your urine turns a dark color or changes to pink, red, or brown. Any of the following symptoms occur over the next 10 days:  You have a temperature by mouth above 102 F (38.9 C), not controlled by medicine.  Shortness of breath.  Weakness after normal activity.  The white part of the eye turns yellow (jaundice).  You have a decrease in the amount of urine or are urinating less often.  Your urine turns a dark color or changes to pink, red, or brown. Document Released: 09/09/2000 Document Revised: 12/05/2011 Document Reviewed: 04/28/2008 [facility] Patient Information 2014 [place], [place].  _______________________________________________________________________

## 2018-03-14 ENCOUNTER — Other Ambulatory Visit: Payer: Self-pay

## 2018-03-14 ENCOUNTER — Encounter (HOSPITAL_COMMUNITY)
Admission: RE | Admit: 2018-03-14 | Discharge: 2018-03-14 | Disposition: A | Discharge status: Home / Self Care | Payer: BLUE CROSS/BLUE SHIELD | Source: Ambulatory Visit | Attending: Obstetrics and Gynecology | Admitting: Obstetrics and Gynecology

## 2018-03-14 ENCOUNTER — Encounter (HOSPITAL_COMMUNITY): Payer: Self-pay

## 2018-03-14 DIAGNOSIS — Z01812 Encounter for preprocedural laboratory examination: Secondary | ICD-10-CM | POA: Diagnosis not present

## 2018-03-14 LAB — BASIC METABOLIC PANEL
Anion gap: 6 (ref 5–15)
BUN: 11 mg/dL (ref 6–20)
CHLORIDE: 104 mmol/L (ref 101–111)
CO2: 30 mmol/L (ref 22–32)
Calcium: 9.3 mg/dL (ref 8.9–10.3)
Creatinine, Ser: 0.87 mg/dL (ref 0.44–1.00)
GFR calc Af Amer: >60 mL/min (ref >60)
GLUCOSE: 107 mg/dL — AB (ref 65–99)
Potassium: 4.7 mmol/L (ref 3.5–5.1)
Sodium: 140 mmol/L (ref 135–145)

## 2018-03-14 LAB — CBC
HEMATOCRIT: 41.6 % (ref 36.0–46.0)
Hemoglobin: 13.9 g/dL (ref 12.0–15.0)
MCH: 32.7 pg (ref 26.0–34.0)
MCHC: 33.4 g/dL (ref 30.0–36.0)
MCV: 97.9 fL (ref 78.0–100.0)
Platelets: 298 10*3/uL (ref 150–400)
RBC: 4.25 MIL/uL (ref 3.87–5.11)
RDW: 13.5 % (ref 11.5–15.5)
WBC: 7.3 10*3/uL (ref 4.0–10.5)

## 2018-03-14 LAB — HCG, SERUM, QUALITATIVE: Preg, Serum: NEGATIVE

## 2018-03-14 LAB — ABO/RH: ABO/RH(D): O POS

## 2018-03-21 ENCOUNTER — Ambulatory Visit (HOSPITAL_COMMUNITY): Payer: BLUE CROSS/BLUE SHIELD | Admitting: Certified Registered Nurse Anesthetist

## 2018-03-21 ENCOUNTER — Encounter (HOSPITAL_COMMUNITY)
Admission: RE | Disposition: A | Discharge status: Home / Self Care | Payer: Self-pay | Source: Ambulatory Visit | Attending: Obstetrics and Gynecology

## 2018-03-21 ENCOUNTER — Encounter (HOSPITAL_COMMUNITY): Payer: Self-pay | Admitting: Certified Registered Nurse Anesthetist

## 2018-03-21 ENCOUNTER — Ambulatory Visit (HOSPITAL_COMMUNITY)
Admission: RE | Admit: 2018-03-21 | Discharge: 2018-03-22 | Disposition: A | Discharge status: Home / Self Care | Payer: BLUE CROSS/BLUE SHIELD | Source: Ambulatory Visit | Attending: Obstetrics and Gynecology | Admitting: Obstetrics and Gynecology

## 2018-03-21 DIAGNOSIS — Z79899 Other long term (current) drug therapy: Secondary | ICD-10-CM | POA: Diagnosis not present

## 2018-03-21 DIAGNOSIS — N838 Other noninflammatory disorders of ovary, fallopian tube and broad ligament: Secondary | ICD-10-CM | POA: Insufficient documentation

## 2018-03-21 DIAGNOSIS — N815 Vaginal enterocele: Secondary | ICD-10-CM | POA: Diagnosis not present

## 2018-03-21 DIAGNOSIS — F418 Other specified anxiety disorders: Secondary | ICD-10-CM | POA: Diagnosis not present

## 2018-03-21 DIAGNOSIS — Z148 Genetic carrier of other disease: Secondary | ICD-10-CM | POA: Diagnosis not present

## 2018-03-21 DIAGNOSIS — N888 Other specified noninflammatory disorders of cervix uteri: Secondary | ICD-10-CM | POA: Insufficient documentation

## 2018-03-21 DIAGNOSIS — Z1509 Genetic susceptibility to other malignant neoplasm: Secondary | ICD-10-CM | POA: Diagnosis not present

## 2018-03-21 DIAGNOSIS — N736 Female pelvic peritoneal adhesions (postinfective): Secondary | ICD-10-CM | POA: Insufficient documentation

## 2018-03-21 DIAGNOSIS — K469 Unspecified abdominal hernia without obstruction or gangrene: Secondary | ICD-10-CM | POA: Diagnosis not present

## 2018-03-21 DIAGNOSIS — N879 Dysplasia of cervix uteri, unspecified: Secondary | ICD-10-CM | POA: Diagnosis not present

## 2018-03-21 HISTORY — PX: ROBOTIC ASSISTED TOTAL HYSTERECTOMY WITH BILATERAL SALPINGO OOPHERECTOMY: SHX6086

## 2018-03-21 LAB — TYPE AND SCREEN
ABO/RH(D): O POS
Antibody Screen: NEGATIVE

## 2018-03-21 SURGERY — HYSTERECTOMY, TOTAL, ROBOT-ASSISTED, LAPAROSCOPIC, WITH BILATERAL SALPINGO-OOPHORECTOMY
Anesthesia: General | Laterality: Bilateral

## 2018-03-21 MED ORDER — ROCURONIUM BROMIDE 100 MG/10ML IV SOLN
INTRAVENOUS | Status: AC
  Filled 2018-03-21: qty 1

## 2018-03-21 MED ORDER — HYDROMORPHONE HCL 1 MG/ML IJ SOLN
INTRAMUSCULAR | Status: AC
  Filled 2018-03-21: qty 1

## 2018-03-21 MED ORDER — MIDAZOLAM HCL 2 MG/2ML IJ SOLN
0.5000 mg | Freq: Once | INTRAMUSCULAR | Status: AC | PRN
  Administered 2018-03-21: 1 mg via INTRAVENOUS

## 2018-03-21 MED ORDER — GABAPENTIN 300 MG PO CAPS
300.0000 mg | ORAL_CAPSULE | Freq: Three times a day (TID) | ORAL | Status: DC
  Administered 2018-03-21 (×2): 300 mg via ORAL

## 2018-03-21 MED ORDER — PROPOFOL 10 MG/ML IV BOLUS
INTRAVENOUS | Status: DC | PRN
  Administered 2018-03-21: 150 mg via INTRAVENOUS

## 2018-03-21 MED ORDER — ONDANSETRON HCL 4 MG/2ML IJ SOLN
4.0000 mg | Freq: Four times a day (QID) | INTRAMUSCULAR | Status: DC | PRN
  Administered 2018-03-21: 4 mg via INTRAVENOUS

## 2018-03-21 MED ORDER — PHENYLEPHRINE 40 MCG/ML (10ML) SYRINGE FOR IV PUSH (FOR BLOOD PRESSURE SUPPORT)
PREFILLED_SYRINGE | INTRAVENOUS | Status: DC | PRN
  Administered 2018-03-21: 80 ug via INTRAVENOUS

## 2018-03-21 MED ORDER — SUGAMMADEX SODIUM 500 MG/5ML IV SOLN
INTRAVENOUS | Status: AC
  Filled 2018-03-21: qty 5

## 2018-03-21 MED ORDER — HYDROMORPHONE 1 MG/ML IV SOLN
INTRAVENOUS | Status: DC
Start: 2018-03-21 — End: 2018-03-22
  Administered 2018-03-21: 4.1 mg via INTRAVENOUS
  Administered 2018-03-21: 16:00:00 via INTRAVENOUS
  Filled 2018-03-21: qty 25

## 2018-03-21 MED ORDER — LACTATED RINGERS IR SOLN
Status: DC | PRN
  Administered 2018-03-21: 1000 mL

## 2018-03-21 MED ORDER — BUPROPION HCL ER (XL) 150 MG PO TB24
150.0000 mg | ORAL_TABLET | Freq: Two times a day (BID) | ORAL | Status: DC
  Administered 2018-03-21: 150 mg via ORAL
  Filled 2018-03-21: qty 1

## 2018-03-21 MED ORDER — HYDROMORPHONE HCL 1 MG/ML IJ SOLN
0.2500 mg | INTRAMUSCULAR | Status: DC | PRN
  Administered 2018-03-21 (×2): 0.5 mg via INTRAVENOUS

## 2018-03-21 MED ORDER — GABAPENTIN 300 MG PO CAPS
ORAL_CAPSULE | ORAL | Status: AC
  Filled 2018-03-21: qty 1

## 2018-03-21 MED ORDER — PROMETHAZINE HCL 25 MG/ML IJ SOLN: 6.2500 mg | INTRAMUSCULAR | Status: DC | PRN

## 2018-03-21 MED ORDER — SODIUM CHLORIDE 0.9 % IV SOLN
INTRAVENOUS | Status: DC | PRN
  Administered 2018-03-21: 60 mL

## 2018-03-21 MED ORDER — LIDOCAINE 2% (20 MG/ML) 5 ML SYRINGE
INTRAMUSCULAR | Status: AC
  Filled 2018-03-21: qty 5

## 2018-03-21 MED ORDER — PROPOFOL 10 MG/ML IV BOLUS
INTRAVENOUS | Status: AC
  Filled 2018-03-21: qty 20

## 2018-03-21 MED ORDER — SODIUM CHLORIDE 0.9 % IJ SOLN
INTRAMUSCULAR | Status: DC | PRN
  Administered 2018-03-21: 30 mL

## 2018-03-21 MED ORDER — ONDANSETRON HCL 4 MG/2ML IJ SOLN
INTRAMUSCULAR | Status: AC
  Filled 2018-03-21: qty 2

## 2018-03-21 MED ORDER — SODIUM CHLORIDE 0.9 % IJ SOLN
INTRAMUSCULAR | Status: AC
  Filled 2018-03-21: qty 50

## 2018-03-21 MED ORDER — LAMOTRIGINE 25 MG PO TABS
50.0000 mg | ORAL_TABLET | Freq: Two times a day (BID) | ORAL | Status: DC
  Administered 2018-03-21: 50 mg via ORAL
  Filled 2018-03-21: qty 2

## 2018-03-21 MED ORDER — DIPHENHYDRAMINE HCL 50 MG/ML IJ SOLN
12.5000 mg | Freq: Four times a day (QID) | INTRAMUSCULAR | Status: DC | PRN
  Administered 2018-03-21 (×2): 12.5 mg via INTRAVENOUS

## 2018-03-21 MED ORDER — MIDAZOLAM HCL 2 MG/2ML IJ SOLN
1.0000 mg | Freq: Once | INTRAMUSCULAR | Status: AC
  Administered 2018-03-21: 1 mg via INTRAVENOUS

## 2018-03-21 MED ORDER — NALOXONE HCL 0.4 MG/ML IJ SOLN: 0.4000 mg | INTRAMUSCULAR | Status: DC | PRN

## 2018-03-21 MED ORDER — TRAMADOL HCL 50 MG PO TABS: 50.0000 mg | ORAL_TABLET | Freq: Four times a day (QID) | ORAL | Status: DC | PRN

## 2018-03-21 MED ORDER — LIDOCAINE 2% (20 MG/ML) 5 ML SYRINGE
INTRAMUSCULAR | Status: DC | PRN
  Administered 2018-03-21: 40 mg via INTRAVENOUS

## 2018-03-21 MED ORDER — DIPHENHYDRAMINE HCL 50 MG/ML IJ SOLN
INTRAMUSCULAR | Status: AC
  Filled 2018-03-21: qty 1

## 2018-03-21 MED ORDER — ROPIVACAINE HCL 5 MG/ML IJ SOLN
INTRAMUSCULAR | Status: AC
  Filled 2018-03-21: qty 30

## 2018-03-21 MED ORDER — DEXTROSE IN LACTATED RINGERS 5 % IV SOLN
INTRAVENOUS | Status: DC
  Administered 2018-03-21: 21:00:00 via INTRAVENOUS

## 2018-03-21 MED ORDER — SUGAMMADEX SODIUM 200 MG/2ML IV SOLN
INTRAVENOUS | Status: DC | PRN
  Administered 2018-03-21: 300 mg via INTRAVENOUS

## 2018-03-21 MED ORDER — DIPHENHYDRAMINE HCL 12.5 MG/5ML PO ELIX
12.5000 mg | ORAL_SOLUTION | Freq: Four times a day (QID) | ORAL | Status: DC | PRN
  Filled 2018-03-21: qty 5

## 2018-03-21 MED ORDER — LACTATED RINGERS IV SOLN
INTRAVENOUS | Status: DC
  Administered 2018-03-21 (×2): via INTRAVENOUS

## 2018-03-21 MED ORDER — SODIUM CHLORIDE 0.9% FLUSH: 9.0000 mL | INTRAVENOUS | Status: DC | PRN

## 2018-03-21 MED ORDER — BUPIVACAINE HCL (PF) 0.25 % IJ SOLN
INTRAMUSCULAR | Status: DC | PRN
  Administered 2018-03-21: 20 mL

## 2018-03-21 MED ORDER — DEXAMETHASONE SODIUM PHOSPHATE 10 MG/ML IJ SOLN
INTRAMUSCULAR | Status: DC | PRN
  Administered 2018-03-21: 10 mg via INTRAVENOUS

## 2018-03-21 MED ORDER — BUPIVACAINE HCL (PF) 0.25 % IJ SOLN
INTRAMUSCULAR | Status: AC
  Filled 2018-03-21: qty 30

## 2018-03-21 MED ORDER — ROCURONIUM BROMIDE 10 MG/ML (PF) SYRINGE
PREFILLED_SYRINGE | INTRAVENOUS | Status: DC | PRN
  Administered 2018-03-21: 20 mg via INTRAVENOUS
  Administered 2018-03-21: 30 mg via INTRAVENOUS
  Administered 2018-03-21: 50 mg via INTRAVENOUS

## 2018-03-21 MED ORDER — CEFAZOLIN SODIUM-DEXTROSE 2-4 GM/100ML-% IV SOLN
2.0000 g | INTRAVENOUS | Status: AC
  Administered 2018-03-21: 2 g via INTRAVENOUS
  Filled 2018-03-21: qty 100

## 2018-03-21 MED ORDER — DEXAMETHASONE SODIUM PHOSPHATE 10 MG/ML IJ SOLN
INTRAMUSCULAR | Status: AC
  Filled 2018-03-21: qty 1

## 2018-03-21 MED ORDER — SCOPOLAMINE 1 MG/3DAYS TD PT72
1.0000 | MEDICATED_PATCH | Freq: Once | TRANSDERMAL | Status: AC
  Administered 2018-03-21: 1 via TRANSDERMAL
  Filled 2018-03-21: qty 1

## 2018-03-21 MED ORDER — FENTANYL CITRATE (PF) 250 MCG/5ML IJ SOLN
INTRAMUSCULAR | Status: DC | PRN
  Administered 2018-03-21: 100 ug via INTRAVENOUS
  Administered 2018-03-21 (×3): 50 ug via INTRAVENOUS

## 2018-03-21 MED ORDER — OXYCODONE-ACETAMINOPHEN 5-325 MG PO TABS
ORAL_TABLET | ORAL | Status: AC
  Filled 2018-03-21: qty 1

## 2018-03-21 MED ORDER — MIDAZOLAM HCL 5 MG/5ML IJ SOLN
INTRAMUSCULAR | Status: DC | PRN
  Administered 2018-03-21: 2 mg via INTRAVENOUS

## 2018-03-21 MED ORDER — FENTANYL CITRATE (PF) 250 MCG/5ML IJ SOLN
INTRAMUSCULAR | Status: AC
  Filled 2018-03-21: qty 5

## 2018-03-21 MED ORDER — PHENAZOPYRIDINE HCL 100 MG PO TABS
100.0000 mg | ORAL_TABLET | Freq: Once | ORAL | Status: AC
  Administered 2018-03-21: 100 mg via ORAL
  Filled 2018-03-21: qty 1

## 2018-03-21 MED ORDER — ONDANSETRON HCL 4 MG/2ML IJ SOLN
INTRAMUSCULAR | Status: DC | PRN
  Administered 2018-03-21: 4 mg via INTRAVENOUS

## 2018-03-21 MED ORDER — MEPERIDINE HCL 50 MG/ML IJ SOLN: 6.2500 mg | INTRAMUSCULAR | Status: DC | PRN

## 2018-03-21 MED ORDER — MIDAZOLAM HCL 2 MG/2ML IJ SOLN
INTRAMUSCULAR | Status: AC
  Filled 2018-03-21: qty 2

## 2018-03-21 MED ORDER — OXYCODONE-ACETAMINOPHEN 5-325 MG PO TABS
1.0000 | ORAL_TABLET | ORAL | Status: DC | PRN
  Administered 2018-03-21: 1 via ORAL
  Administered 2018-03-22 (×2): 2 via ORAL

## 2018-03-21 SURGICAL SUPPLY — 54 items
ADH SKN CLS APL DERMABOND .7 (GAUZE/BANDAGES/DRESSINGS) ×1
BARRIER ADHS 3X4 INTERCEED (GAUZE/BANDAGES/DRESSINGS) IMPLANT
BRR ADH 4X3 ABS CNTRL BYND (GAUZE/BANDAGES/DRESSINGS)
CANISTER SUCT 3000ML PPV (MISCELLANEOUS) ×1 IMPLANT
CATH FOLEY 3WAY  5CC 16FR (CATHETERS) ×1
CATH FOLEY 3WAY 5CC 16FR (CATHETERS) ×1 IMPLANT
COVER BACK TABLE 60X90IN (DRAPES) ×2 IMPLANT
COVER TIP SHEARS 8 DVNC (MISCELLANEOUS) ×1 IMPLANT
COVER TIP SHEARS 8MM DA VINCI (MISCELLANEOUS) ×1
DECANTER SPIKE VIAL GLASS SM (MISCELLANEOUS) ×4 IMPLANT
DEFOGGER SCOPE WARMER CLEARIFY (MISCELLANEOUS) ×2 IMPLANT
DERMABOND ADVANCED (GAUZE/BANDAGES/DRESSINGS) ×1
DERMABOND ADVANCED .7 DNX12 (GAUZE/BANDAGES/DRESSINGS) ×1 IMPLANT
DRAPE ARM DVNC X/XI (DISPOSABLE) ×4 IMPLANT
DRAPE COLUMN DVNC XI (DISPOSABLE) ×1 IMPLANT
DRAPE DA VINCI XI ARM (DISPOSABLE) ×4
DRAPE DA VINCI XI COLUMN (DISPOSABLE) ×1
DURAPREP 26ML APPLICATOR (WOUND CARE) ×2 IMPLANT
ELECT REM PT RETURN 15FT ADLT (MISCELLANEOUS) ×2 IMPLANT
GLOVE BIO SURGEON STRL SZ7.5 (GLOVE) ×6 IMPLANT
GLOVE BIOGEL PI IND STRL 7.0 (GLOVE) ×2 IMPLANT
GLOVE BIOGEL PI INDICATOR 7.0 (GLOVE) ×2
IRRIG SUCT STRYKERFLOW 2 WTIP (MISCELLANEOUS) ×2
IRRIGATION SUCT STRKRFLW 2 WTP (MISCELLANEOUS) ×1 IMPLANT
LEGGING LITHOTOMY PAIR STRL (DRAPES) ×1 IMPLANT
NEEDLE INSUFFLATION 150MM (ENDOMECHANICALS) ×2 IMPLANT
OBTURATOR OPTICAL STANDARD 8MM (TROCAR) ×1
OBTURATOR OPTICAL STND 8 DVNC (TROCAR) ×1
OBTURATOR OPTICALSTD 8 DVNC (TROCAR) IMPLANT
OCCLUDER COLPOPNEUMO (BALLOONS) ×2 IMPLANT
PACK ROBOT WH (CUSTOM PROCEDURE TRAY) ×2 IMPLANT
PACK ROBOTIC GOWN (GOWN DISPOSABLE) ×2 IMPLANT
PACK TRENDGUARD 450 HYBRID PRO (MISCELLANEOUS) IMPLANT
PAD PREP 24X48 CUFFED NSTRL (MISCELLANEOUS) ×2 IMPLANT
POSITIONER SURGICAL ARM (MISCELLANEOUS) ×4 IMPLANT
SEAL CANN UNIV 5-8 DVNC XI (MISCELLANEOUS) ×3 IMPLANT
SEAL XI 5MM-8MM UNIVERSAL (MISCELLANEOUS) ×4
SET CYSTO W/LG BORE CLAMP LF (SET/KITS/TRAYS/PACK) IMPLANT
SET TRI-LUMEN FLTR TB AIRSEAL (TUBING) ×2 IMPLANT
SUT VIC AB 0 CT1 27 (SUTURE) ×4
SUT VIC AB 0 CT1 27XBRD ANBCTR (SUTURE) ×2 IMPLANT
SUT VICRYL 0 UR6 27IN ABS (SUTURE) ×2 IMPLANT
SUT VICRYL RAPIDE 4/0 PS 2 (SUTURE) ×4 IMPLANT
SUT VLOC 180 0 9IN  GS21 (SUTURE) ×1
SUT VLOC 180 0 9IN GS21 (SUTURE) IMPLANT
TIP RUMI ORANGE 6.7MMX12CM (TIP) IMPLANT
TIP UTERINE 5.1X6CM LAV DISP (MISCELLANEOUS) IMPLANT
TIP UTERINE 6.7X10CM GRN DISP (MISCELLANEOUS) IMPLANT
TIP UTERINE 6.7X6CM WHT DISP (MISCELLANEOUS) IMPLANT
TIP UTERINE 6.7X8CM BLUE DISP (MISCELLANEOUS) ×1 IMPLANT
TOWEL OR 17X26 10 PK STRL BLUE (TOWEL DISPOSABLE) ×4 IMPLANT
TRENDGUARD 450 HYBRID PRO PACK (MISCELLANEOUS) ×2
TROCAR PORT AIRSEAL 8X120 (TROCAR) ×2 IMPLANT
WATER STERILE IRR 1000ML POUR (IV SOLUTION) ×1 IMPLANT

## 2018-03-21 NOTE — Progress Notes (Signed)
Reduced dose dilaudid PCA discontinued. Pt pain controlled using PO pain medication. Syringe with 17mL wasted in sink, witnessed by Philomena CourseLeesa James, RN. Pt resting comfortably. Will continue to monitor.  Lavell AnchorsKendell M Monisha Siebel, RN 03/21/2018 9:42 PM

## 2018-03-21 NOTE — Progress Notes (Signed)
Patient seen and examined. Consent witnessed and signed. No changes noted. Update completed. 

## 2018-03-21 NOTE — Op Note (Signed)
NAME: Nicole Huang, Wenona J. MEDICAL RECORD [id number] ACCOUNT [id number] DATE OF BIRTH:Jul 14, 1971 FACILITY: [facility] LOCATION: [facility] PHYSICIAN:Jagger Beahm J. Billy CoastAAVON, MD  OPERATIVE REPORT  DATE OF PROCEDURE:  03/21/2018  CHIEF COMPLAINT:  Carrier of Lynch syndrome gene for definitive therapy due to increased malignancy risk.  POSTOPERATIVE DIAGNOSIS:  Carrier of Lynch syndrome gene for definitive therapy due to increased malignancy risk, enterocele.Pelvic adhesions.  PROCEDURE:  Da Vinci-assisted total laparoscopic hysterectomy, bilateral salpingo-oophorectomy, lysis of adhesions in the left adnexa to the sigmoid colon and McCall culdoplasty.  SURGEON:  Olivia Mackieichard Maryjean Corpening, MD  ASSISTANT:  Fredric MareBailey.  ANESTHESIA:  Local and general.  ESTIMATED BLOOD LOSS:  25 mL.  COMPLICATIONS:  None.  DRAINS:  Foley.  COUNTS:  Correct.  INDICATIONS:  The patient to recovery in good condition.  BRIEF OPERATIVE NOTE:  After being apprised of the risks of anesthesia, infection, bleeding, injury to surrounding organs, possible need for repair, delayed risks and complications including bowel and bladder injury, possible need for repair, the patient  was brought to the operating room where she was administered general anesthetic without complications.  Prepped and draped in the usual sterile fashion.  Foley catheter placed.  Feet are placed in the Yellofin stirrups.  Exam under anesthesia reveals a  bulky anteflexed uterus, no adnexal masses.  At this time, a Rumi retractor placed vaginally without difficulty in standard fashion.  Attention turned to the abdominal portion of the procedure whereby an infraumbilical incision was made with a scalpel.   Veress needle placed.  Opening pressure of 1.  Three liters of CO2 insufflated without difficulty.  Trocar placed atraumatically.  Visualization reveals adnexal adhesions in the left adnexa to the sigmoid colon.  Anterior cul-de-sac adhesions due to  previous C-section  of the bladder flap otherwise normal posterior cul-de-sac, normal appendix seen, normal liver, gallbladder, but appreciate normal diaphragm.  At this time, robotic ports placed on the left and the right and an AirSeal port on the left.   All were placed under direct visualization and the robot had been docked in the standard fashion with placement of the long forceps and the EndoShears.  Attention was turned to the left adnexa whereby these adhesions to the left sigmoid colon are lysed  sharply and the ureter was identified.  The infundibulopelvic ligament was skeletonized via retroperitoneal entry and cauterized using bipolar cautery in multiple points and divided.  At this time, the retroperitoneal space was further developed and the  posterior leaf of the broad ligament is incised down to the level of the cervicovaginal junction.  The round ligament on the left was opened and the bladder flap was developed sharply.  The uterine vessels were skeletonized on the left and cauterized  with bipolar cautery, but not divided.  On the right side, the infundibulopelvic ligament was skeletonized at the retroperitoneal entry.  After identification of the course of the ureter, infundibulopelvic ligament is cauterized using bipolar cautery and  cut.  The broad ligament also developed to the level of the cervicovaginal junction.  The round ligament opened the uterine vessels on the right skeletonized with sharp dissection.  Bladder flap due to dense bladder adhesions was performed  atraumatically using sharp and blunt dissection.  Please note the patient received 100 mg of Pyridium prior to stain the urine orange in the case of inadvertent bladder entry.  No orange extravasation is seen during the entire case.  Uterine vessel was  then cauterized and cut bilaterally.  The cervicovaginal junction and cervix well developed  and scored using monopolar cautery above the level of the attachment of the uterosacral ligaments  and specimen was detached and removed vaginally without  difficulty.  The vaginal closure was performed using a 0 V-Loc suture in a continuous running fashion.  Second imbricating layer placed.  McCall culdoplasty suture placed as well.  Good hemostasis was noted.  Irrigation was accomplished.  The ureters are  inspected and found to be peristalsing normally bilaterally.  There was no evidence of extravasation of fluid in the bladder or evidence of bladder injury.  The pedicles are hemostatic.  Dilute ropivacaine solution was placed in the pelvis.  All  instruments removed.  CO2 released.  Positive pressure applied.  Incisions closed using 4-0 Vicryl.  Dilute Marcaine solution was placed in all incisions.  Vaginal exam reveals a well-approximated, well-supported vaginal cuff and clear urine.  The  patient was transferred to recovery in good condition.  TN/NUANCE  D:03/21/2018 T:03/21/2018 JOB:[id number]

## 2018-03-21 NOTE — Transfer of Care (Signed)
Immediate Anesthesia Transfer of Care Note  Patient: Nicole Huang  Procedure(s) Performed: XI ROBOTIC ASSISTED TOTAL HYSTERECTOMY WITH BILATERAL SALPINGO OOPHORECTOMY (Bilateral )  Patient Location: PACU  Anesthesia Type:General  Level of Consciousness: awake, alert , oriented and patient cooperative  Airway & Oxygen Therapy: Patient Spontanous Breathing and Patient connected to face mask oxygen  Post-op Assessment: Report given to RN, Post -op Vital signs reviewed and stable and Patient moving all extremities  Post vital signs: Reviewed and stable  Last Vitals:  Vitals Value Taken Time  BP 113/55 03/21/2018  3:10 PM  Temp    Pulse 78 03/21/2018  3:13 PM  Resp 14 03/21/2018  3:13 PM  SpO2 100 % 03/21/2018  3:13 PM  Vitals shown include unvalidated device data.  Last Pain:  Vitals:   03/21/18 1148  TempSrc:   PainSc: 0-No pain         Complications: No apparent anesthesia complications

## 2018-03-21 NOTE — H&P (Signed)
NAME: Nicole Huang, Bevin J. MEDICAL RECORD [id number] ACCOUNT [id number] DATE OF BIRTH:1971-03-07 [facility]ACILITY[facility] LOCATION: [facility] PHYSICIAN:Compton Brigance J. Billy CoastAAVON, MD  HISTORY AND PHYSICAL  DATE OF ADMISSION:  03/21/2018  CHIEF COMPLAINT:  Carrier for Lynch syndrome with high risk for ovarian and uterine malignancy for definitive therapy.  HISTORY OF PRESENT ILLNESS:  She is a 47 year old white female G3, P2 who presents with the aforementioned indication for definitive therapy.  ALLERGIES:  She has no known drug allergies.  MEDICATIONS:  Multiple, see list.  FAMILY HISTORY:  Ovarian cancer, breast cancer and colon cancer.  SOCIAL HISTORY:  She is a nonsmoker, nondrinker.  Denies domestic and physical violence.  PAST SURGICAL HISTORY:  C-section x2, vaginal delivery x1.  PHYSICAL EXAMINATION: GENERAL:  Well-developed, well-nourished white female in no acute distress.   HEENT:  Normal. NECK:  Supple, full range of motion. LUNGS:  Clear. HEART:  Regular rate and rhythm. ABDOMEN:  Soft. PELVIC:  Reveals a midline anteflexed uterus.  No adnexal masses. EXTREMITIES:  No cords. NEUROLOGIC:  Nonfocal.  ASSESSMENT:  A carrier for Lynch syndrome with high risk of ovarian and uterine malignancy for definitive therapy.  PLAN:  Proceed with da Vinci total laparoscopic hysterectomy, bilateral salpingo-oophorectomy.    Risks of anesthesia, infection, bleeding, injury to surrounding organs, possible need for repair discussed,  complications to include bowel or bladder injury noted, inability to prevent future cancer despite therapy discussed.  The patient acknowledges  wishes to proceed.  AN/NUANCE  D:03/21/2018 T:03/21/2018 JOB:[id number]

## 2018-03-21 NOTE — H&P (Signed)
NAME: Nicole Huang, [name] MEDICAL RECORD [id number] ACCOUNT [id number] DATE OF BIRTH:1971/09/11 [facility]ILITY[facility] LOCATION: [facility] PHYSICIAN:Shigeo Baugh J. Billy CoastAAVON, MD  HISTORY AND PHYSICAL  DATE OF ADMISSION:  03/21/2018  CHIEF COMPLAINT:  Positive history of Lynch syndrome for definitive therapy due to increased risk of malignancy.  HISTORY OF PRESENT ILLNESS:  She is a 47 year old white female G3 P3, history of a C-section x2 who presents now for definitive therapy due to high malignancy risk due to known carrier of the gene for Lynch syndrome.  MEDICATIONS:  Paragard, Adderall, trazodone, Lamictal, Wellbutrin, buspirone, gabapentin and Effexor.  SOCIAL HISTORY:  She is a nonsmoker, nondrinker.  She denies domestic physical violence.  FAMILY HISTORY:  Breast cancer, Lynch syndrome as noted, colon cancer, skin cancer, uterine cancer and kidney disease.  PAST SURGICAL HISTORY:  C-section x2, vaginal delivery x1.  No other surgical procedures.  PHYSICAL EXAMINATION: GENERAL:  She is a well-developed, well-nourished white female in no acute distress. HEENT:  Normal. NECK:  Supple.  Full range of motion. LUNGS:  Clear. HEART:  Regular rhythm. ABDOMEN:  Soft, nontender. PELVIC:  Reveals a bulky midposition anteflexed uterus and no adnexal masses. EXTREMITIES:  No cords. NEUROLOGIC:  Nonfocal. SKIN:  Intact.  IMPRESSION:  A carrier for Lynch syndrome with high risk for ovarian and uterine malignancy.  PLAN:  To proceed with da Vinci TLH, bilateral salpingo-oophorectomy.  Risks of anesthesia, infection, bleeding, injury to surrounding organs, possible need for repair discussed, delayed risks and complications to include bowel and bladder injury noted.   The patient acknowledges and wishes to proceed.  LN/NUANCE  D:03/20/2018 T:03/20/2018 JOB:[id number]

## 2018-03-21 NOTE — Anesthesia Procedure Notes (Signed)
Procedure Name: Intubation Date/Time: 03/21/2018 1:18 PM Performed by: Mitzie Na, CRNA Pre-anesthesia Checklist: Patient identified, Emergency Drugs available, Suction available, Patient being monitored and Timeout performed Patient Re-evaluated:Patient Re-evaluated prior to induction Oxygen Delivery Method: Circle system utilized Preoxygenation: Pre-oxygenation with 100% oxygen Induction Type: IV induction Ventilation: Mask ventilation without difficulty Laryngoscope Size: Mac and 3 Grade View: Grade I Tube type: Oral Tube size: 7.0 mm Number of attempts: 1 Airway Equipment and Method: Stylet Placement Confirmation: ETT inserted through vocal cords under direct vision,  positive ETCO2 and breath sounds checked- equal and bilateral Secured at: 22 cm Tube secured with: Tape Dental Injury: Teeth and Oropharynx as per pre-operative assessment

## 2018-03-21 NOTE — Anesthesia Postprocedure Evaluation (Signed)
Anesthesia Post Note  Patient: Nicole Huang  Procedure(s) Performed: XI ROBOTIC ASSISTED TOTAL HYSTERECTOMY WITH BILATERAL SALPINGO OOPHORECTOMY (Bilateral )     Patient location during evaluation: PACU Anesthesia Type: General Level of consciousness: awake and alert, oriented and patient cooperative Pain management: pain level controlled (pain improving) Vital Signs Assessment: post-procedure vital signs reviewed and stable Respiratory status: spontaneous breathing, nonlabored ventilation, respiratory function stable and patient connected to nasal cannula oxygen Cardiovascular status: blood pressure returned to baseline and stable Postop Assessment: no apparent nausea or vomiting Anesthetic complications: no    Last Vitals:  Vitals:   03/21/18 1600 03/21/18 1615  BP: 106/73 111/69  Pulse: (!) 55 64  Resp: 13 18  Temp:    SpO2: 97% 96%    Last Pain:  Vitals:   03/21/18 1615  TempSrc:   PainSc: 7                  Deisha Stull,E. Nathaneil Feagans

## 2018-03-21 NOTE — Anesthesia Preprocedure Evaluation (Addendum)
Anesthesia Evaluation  Patient identified by MRN, date of birth, ID band Patient awake    Reviewed: Allergy & Precautions, NPO status , Patient's Chart, lab work & pertinent test results  History of Anesthesia Complications Negative for: history of anesthetic complications  Airway Mallampati: I  TM Distance: >3 FB Neck ROM: Full    Dental  (+) Chipped, Dental Advisory Given   Pulmonary Current Smoker,    breath sounds clear to auscultation       Cardiovascular negative cardio ROS   Rhythm:Regular Rate:Normal     Neuro/Psych PSYCHIATRIC DISORDERS (ADD) Anxiety Depression negative neurological ROS     GI/Hepatic negative GI ROS, (+)     substance abuse  alcohol use,   Endo/Other  negative endocrine ROS  Renal/GU negative Renal ROS     Musculoskeletal   Abdominal   Peds  Hematology negative hematology ROS (+)   Anesthesia Other Findings   Reproductive/Obstetrics LMP last week                            Anesthesia Physical Anesthesia Plan  ASA: II  Anesthesia Plan: General   Post-op Pain Management:    Induction: Intravenous  PONV Risk Score and Plan: 4 or greater and Ondansetron, Dexamethasone and Scopolamine patch - Pre-op  Airway Management Planned: Oral ETT  Additional Equipment:   Intra-op Plan:   Post-operative Plan: Extubation in OR  Informed Consent: I have reviewed the patients History and Physical, chart, labs and discussed the procedure including the risks, benefits and alternatives for the proposed anesthesia with the patient or authorized representative who has indicated his/her understanding and acceptance.   Dental advisory given  Plan Discussed with: CRNA and Surgeon  Anesthesia Plan Comments: (Plan routine monitors, GETA)       Anesthesia Quick Evaluation

## 2018-03-21 NOTE — Op Note (Signed)
03/21/2018  3:01 PM  PATIENT:  Marcellus ScottLaura J Dockery  47 y.o. female  PRE-OPERATIVE DIAGNOSIS:  Lynch Syndrome  POST-OPERATIVE DIAGNOSIS:  Lynch Syndrome ENTEROCELE  PROCEDURE:  Procedure(s): XI ROBOTIC ASSISTED TOTAL HYSTERECTOMY BILATERAL SALPINGO OOPHORECTOMY LYSIS OF ADHESIONS IN LEFT ADNEXA MCCALL CUL DE PLASTY  SURGEON:  Surgeon(s): Olivia Mackieaavon, Donette Mainwaring, MD  ASSISTANTSFredric Mare: BAILEY, CNM, FACM  ANESTHESIA:   local and general  ESTIMATED BLOOD LOSS: 25 mL   DRAINS: Urinary Catheter (Foley)   LOCAL MEDICATIONS USED:  MARCAINE    and Amount: 20 ml  SPECIMEN:  Source of Specimen:  UTERUS , CERVIX AND OVARIES  DISPOSITION OF SPECIMEN:  PATHOLOGY  COUNTS:  YES  DICTATION #: [id number]  PLAN OF CARE: 23 HR EXTENDED STAY  PATIENT DISPOSITION:  PACU - hemodynamically stable.

## 2018-03-22 ENCOUNTER — Encounter (HOSPITAL_COMMUNITY): Payer: Self-pay | Admitting: Obstetrics and Gynecology

## 2018-03-22 DIAGNOSIS — N888 Other specified noninflammatory disorders of cervix uteri: Secondary | ICD-10-CM | POA: Diagnosis not present

## 2018-03-22 DIAGNOSIS — N838 Other noninflammatory disorders of ovary, fallopian tube and broad ligament: Secondary | ICD-10-CM | POA: Diagnosis not present

## 2018-03-22 DIAGNOSIS — N736 Female pelvic peritoneal adhesions (postinfective): Secondary | ICD-10-CM | POA: Diagnosis not present

## 2018-03-22 DIAGNOSIS — Z1509 Genetic susceptibility to other malignant neoplasm: Secondary | ICD-10-CM | POA: Diagnosis not present

## 2018-03-22 DIAGNOSIS — Z79899 Other long term (current) drug therapy: Secondary | ICD-10-CM | POA: Diagnosis not present

## 2018-03-22 DIAGNOSIS — N815 Vaginal enterocele: Secondary | ICD-10-CM | POA: Diagnosis not present

## 2018-03-22 DIAGNOSIS — F418 Other specified anxiety disorders: Secondary | ICD-10-CM | POA: Diagnosis not present

## 2018-03-22 LAB — BASIC METABOLIC PANEL
Anion gap: 9 (ref 5–15)
BUN: 10 mg/dL (ref 6–20)
CALCIUM: 8.9 mg/dL (ref 8.9–10.3)
CO2: 26 mmol/L (ref 22–32)
CREATININE: 0.8 mg/dL (ref 0.44–1.00)
Chloride: 102 mmol/L (ref 98–111)
Glucose, Bld: 129 mg/dL — ABNORMAL HIGH (ref 70–99)
Potassium: 4.1 mmol/L (ref 3.5–5.1)
SODIUM: 137 mmol/L (ref 135–145)

## 2018-03-22 LAB — CBC
HCT: 38.1 % (ref 36.0–46.0)
Hemoglobin: 12.6 g/dL (ref 12.0–15.0)
MCH: 32.3 pg (ref 26.0–34.0)
MCHC: 33.1 g/dL (ref 30.0–36.0)
MCV: 97.7 fL (ref 78.0–100.0)
PLATELETS: 284 10*3/uL (ref 150–400)
RBC: 3.9 MIL/uL (ref 3.87–5.11)
RDW: 13.6 % (ref 11.5–15.5)
WBC: 14.2 10*3/uL — AB (ref 4.0–10.5)

## 2018-03-22 MED ORDER — OXYCODONE-ACETAMINOPHEN 5-325 MG PO TABS
ORAL_TABLET | ORAL | Status: AC
  Filled 2018-03-22: qty 2

## 2018-03-22 MED ORDER — TRAMADOL HCL 50 MG PO TABS: 50.0000 mg | ORAL_TABLET | Freq: Four times a day (QID) | ORAL | 0 refills | Status: DC | PRN

## 2018-03-22 MED ORDER — OXYCODONE HCL 5 MG PO TABS: 5.0000 mg | ORAL_TABLET | Freq: Four times a day (QID) | ORAL | 0 refills | Status: DC | PRN

## 2018-03-22 MED ORDER — OXYCODONE-ACETAMINOPHEN 5-325 MG PO TABS: 1.0000 | ORAL_TABLET | ORAL | 0 refills | Status: DC | PRN

## 2018-03-22 NOTE — Progress Notes (Signed)
POD 1 Doing well Pos flatus Voiding No bleeding BP 106/60 (BP Location: Right Arm)   Pulse 61   Temp 98.2 F (36.8 C)   Resp 16   Ht 5\' 6"  (1.676 m)   Wt 61.2 kg (135 lb)   LMP 03/11/2018 (Exact Date)   SpO2 99%   BMI 21.79 kg/m   CBC    Component Value Date/Time   WBC 14.2 (H) 03/22/2018 0501   RBC 3.90 03/22/2018 0501   HGB 12.6 03/22/2018 0501   HCT 38.1 03/22/2018 0501   PLT 284 03/22/2018 0501   MCV 97.7 03/22/2018 0501   MCH 32.3 03/22/2018 0501   MCHC 33.1 03/22/2018 0501   RDW 13.6 03/22/2018 0501   HEENT NL Neck supple Lungs: CTA CV:RRR ABD: NT, Pos BS, Incisions C/D/I NO CVAT EXT: neg cords Skin: intact IMP: Stable POD 1 DC home

## 2018-04-04 DIAGNOSIS — N3289 Other specified disorders of bladder: Secondary | ICD-10-CM | POA: Diagnosis not present

## 2018-04-24 ENCOUNTER — Ambulatory Visit: Payer: BLUE CROSS/BLUE SHIELD | Admitting: Gastroenterology

## 2018-04-24 ENCOUNTER — Encounter: Payer: Self-pay | Admitting: Gastroenterology

## 2018-04-24 ENCOUNTER — Encounter (INDEPENDENT_AMBULATORY_CARE_PROVIDER_SITE_OTHER): Payer: Self-pay

## 2018-04-24 VITALS — BP 104/70 | HR 84 | Ht 66.0 in | Wt 136.0 lb

## 2018-04-24 DIAGNOSIS — Z1509 Genetic susceptibility to other malignant neoplasm: Secondary | ICD-10-CM | POA: Diagnosis not present

## 2018-04-24 MED ORDER — PEG-KCL-NACL-NASULF-NA ASC-C 140 G PO SOLR: 140.0000 g | ORAL | 0 refills | Status: DC

## 2018-04-24 NOTE — Progress Notes (Signed)
[facility] Consult Note:  History: Nicole Huang 04/24/2018  Referring physician: Velna Hatchet, MD  Reason for consult/chief complaint: No chief complaint on file. 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  Patient's mother was diagnosed with breast cancer and reportedly found to have a Lynch syndrome mutation.  Her oncologist recommended that her children be tested as well.  Nicole Huang was then put in touch with a company called [employer] that performs genetic testing, and she received a report that she was positive for the PMS to Lynch syndrome mutation.  With that information, she recently underwent TAH/BSO.  She is here to discuss colon cancer screening and possible upper endoscopy as well.  She denies abdominal pain, her bowel habits had recently changed with some more frequent stools since her pelvic surgery.  She denies rectal bleeding, dysphagia, nausea or vomiting.   ROS:  Review of Systems  Constitutional: Positive for fatigue. Negative for appetite change and unexpected weight change.  HENT: Negative for mouth sores and voice change.   Eyes: Negative for pain and redness.  Respiratory: Negative for cough and shortness of breath.   Cardiovascular: Negative for chest pain and palpitations.  Genitourinary: Negative for dysuria and hematuria.  Musculoskeletal: Negative for arthralgias and myalgias.  Skin: Negative for pallor and rash.  Neurological: Negative for weakness and headaches.  Hematological: Negative for adenopathy.  Psychiatric/Behavioral: The patient is nervous/anxious.      Past Medical History: Past Medical History:  Diagnosis Date  . ADHD (attention deficit hyperactivity disorder)   . Alcoholism ([place])   . Anxiety   . Depression   .  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 Lynch syndrome      Past Surgical History: Past Surgical History:  Procedure Laterality Date  . CESAREAN SECTION     x2  . ROBOTIC ASSISTED TOTAL HYSTERECTOMY WITH BILATERAL SALPINGO OOPHERECTOMY Bilateral 03/21/2018   Procedure: XI ROBOTIC ASSISTED TOTAL HYSTERECTOMY WITH BILATERAL SALPINGO OOPHORECTOMY;  Surgeon: Brien Few, MD;  Location: [facility];  Service: Gynecology;  Laterality: Bilateral;     Family History: Family History  Problem Relation Age of Onset  . Skin cancer Mother   . Hypertension Mother    Maternal aunt had both uterine and breast cancer Another maternal aunt had breast cancer The patient's mother had breast cancer in her 47s At least 44 of the patient's sisters has also tested positive for Lynch syndrome mutation. Maternal grandmother breast cancer in her 51s Maternal grandfather kidney cancer  Social History: Social History   Socioeconomic History  . Marital status: Married    Spouse name: Not on file  . Number of children: 2  . Years of education: Not on file  . Highest education level: Not on file  Occupational History  . Not on file  Social Needs  . Financial resource strain: Not on file  . Food insecurity:    Worry: Not on file    Inability: Not on file  . Transportation needs:    Medical: Not on file    Non-medical: Not on file  Tobacco Use  . Smoking status: Former Research scientist (life sciences)  . Smokeless tobacco: Never Used  . Tobacco comment: Pt wears nicotine patch daily  Substance and Sexual Activity  . Alcohol use: Not Currently  .  Drug use: No  . Sexual activity: Yes    Birth control/protection: IUD  Lifestyle  . Physical activity:    Days per week: Not on file    Minutes per session: Not on file  . Stress: Not on file  Relationships  . Social connections:    Talks on phone: Not on file    Gets together: Not on file    Attends religious service: Not on file    Active member of club or  organization: Not on file    Attends meetings of clubs or organizations: Not on file    Relationship status: Not on file  Other Topics Concern  . Not on file  Social History Narrative  . Not on file   Went through rehab for alcohol dependency  Allergies: Allergies  Allergen Reactions  . Sulfa Antibiotics Nausea And Vomiting    Outpatient Meds: Current Outpatient Medications  Medication Sig Dispense Refill  . amphetamine-dextroamphetamine (ADDERALL XR) 25 MG 24 hr capsule Take 25 mg by mouth every morning.    Marland Kitchen buPROPion (WELLBUTRIN XL) 150 MG 24 hr tablet Take 150 mg by mouth 2 (two) times daily.   2  . busPIRone (BUSPAR) 15 MG tablet Take 15 mg by mouth 3 (three) times daily.  2  . gabapentin (NEURONTIN) 300 MG capsule Take 300 mg by mouth 3 (three) times daily.  2  . lamoTRIgine (LAMICTAL) 100 MG tablet Take 50 mg by mouth 2 (two) times daily.  2  . Nutritional Supplements (JUICE PLUS FIBRE PO) Take 4 tablets by mouth daily. 2 CAPSULES OF FRUIT BLEND + 2 CAPSULES OF VEGETABLE BLEND    . Omega-3 Fatty Acids (FISH OIL) 1000 MG CAPS Take 2,000 mg by mouth daily.    . traZODone (DESYREL) 150 MG tablet Take 150 mg by mouth at bedtime as needed for sleep.  0  . tretinoin (RETIN-A) 0.1 % cream Apply 1 application topically every other day. AT NIGHT.  10  . PEG-KCl-NaCl-NaSulf-Na Asc-C (PLENVU) 140 g SOLR Take 140 g by mouth as directed. 1 each 0   No current facility-administered medications for this visit.       ___________________________________________________________________ Objective   Exam:  BP 104/70 (BP Location: Left Arm, Patient Position: Sitting, Cuff Size: Normal)   Pulse 84   Ht '5\' 6"'$  (1.676 m)   Wt 136 lb (61.7 kg)   BMI 21.95 kg/m    General: this is a(n) pleasant and well-appearing woman  Eyes: sclera anicteric, no redness  ENT: oral mucosa moist without lesions, no cervical or supraclavicular lymphadenopathy, good dentition  CV: RRR without murmur,  S1/S2, no JVD, no peripheral edema  Resp: clear to auscultation bilaterally, normal RR and effort noted  GI: soft, no tenderness, with active bowel sounds. No guarding or palpable organomegaly noted.  Skin; warm and dry, no rash or jaundice noted  Neuro: awake, alert and oriented x 3. Normal gross motor function and fluent speech  Labs:  CBC Latest Ref Rng & Units 03/22/2018 03/14/2018  WBC 4.0 - 10.5 K/uL 14.2(H) 7.3  Hemoglobin 12.0 - 15.0 g/dL 12.6 13.9  Hematocrit 36.0 - 46.0 % 38.1 41.6  Platelets 150 - 400 K/uL 284 298     Assessment: Encounter Diagnosis  Name Primary?  Marland Kitchen PMS2-related Lynch syndrome (HNPCC4) Yes    Review of the primary care note indicates that they documented this positive test.  Patient is at increased risk for colorectal cancer and needs a screening colonoscopy.  She will  also need one every 1 to 2 years afterwards.  Recommendations are less certain on how often to do an upper endoscopy, but at least one is recommended when the diagnosis is first made.  She is agreeable to EGD and colonoscopy after thorough discussion of procedure and risks.  The benefits and risks of the planned procedure were described in detail with the patient or (when appropriate) their health care proxy.  Risks were outlined as including, but not limited to, bleeding, infection, perforation, adverse medication reaction leading to cardiac or pulmonary decompensation, or pancreatitis (if ERCP).  The limitation of incomplete mucosal visualization was also discussed.  No guarantees or warranties were given.    Thank you for the courtesy of this consult.  Please call me with any questions or concerns.  Nelida Meuse III  CC: Nicole Hatchet, MD

## 2018-04-24 NOTE — Patient Instructions (Addendum)
If you are age 45 or older, your body mass index should be between 23-30. Your Body mass index is 21.95 kg/m. If this is out of the aforementioned range listed, please consider follow up with your Primary Care Provider.  If you are age 45 or younger, your body mass index should be between 19-25. Your Body mass index is 21.95 kg/m. If this is out of the aformentioned range listed, please consider follow up with your Primary Care Provider.   You have been scheduled for a EGD/ colonoscopy. Please follow written instructions given to you at your visit today.  Please pick up your prep supplies at the pharmacy within the next 1-3 days. If you use inhalers (even only as needed), please bring them with you on the day of your procedure. Your physician has requested that you go to www.startemmi.com and enter the access code given to you at your visit today. This web site gives a general overview about your procedure. However, you should still follow specific instructions given to you by our office regarding your preparation for the procedure.  It was a pleasure to meet you today!  Dr. Myrtie Neitheranis

## 2018-04-26 DIAGNOSIS — F902 Attention-deficit hyperactivity disorder, combined type: Secondary | ICD-10-CM | POA: Diagnosis not present

## 2018-04-26 DIAGNOSIS — Z79899 Other long term (current) drug therapy: Secondary | ICD-10-CM | POA: Diagnosis not present

## 2018-05-09 IMAGING — US US RENAL
1 series · 14 of 25 positions shown · non-contrast
Comparison: None.

CLINICAL DATA: Family history of renal cell carcinoma

EXAM:
RENAL / URINARY TRACT ULTRASOUND COMPLETE

[Series 1: us renal · 0.23mm/px · 14 of 32 slices shown]
[im 1/32]
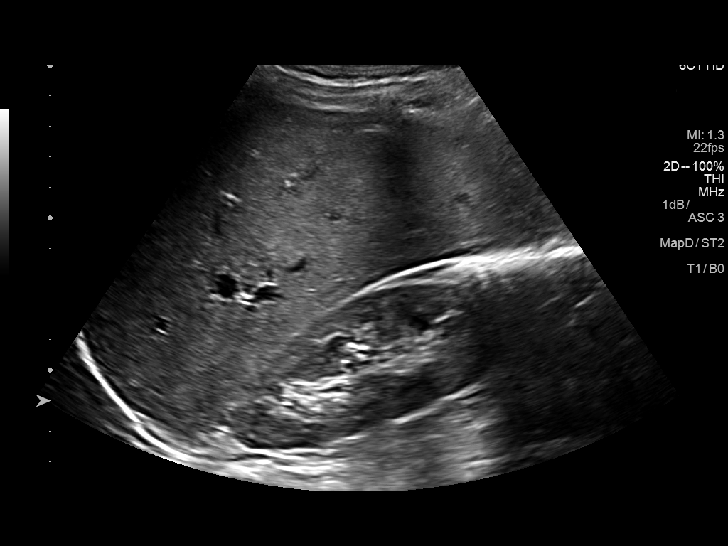
[im 3/32]
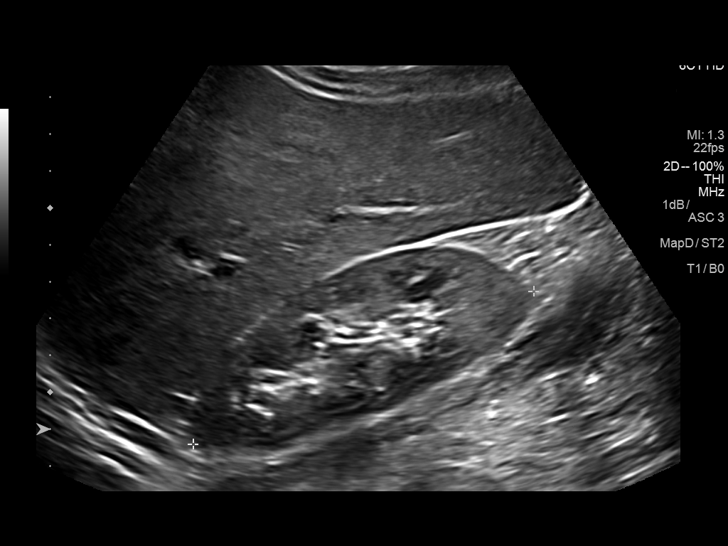
[im 6/32]
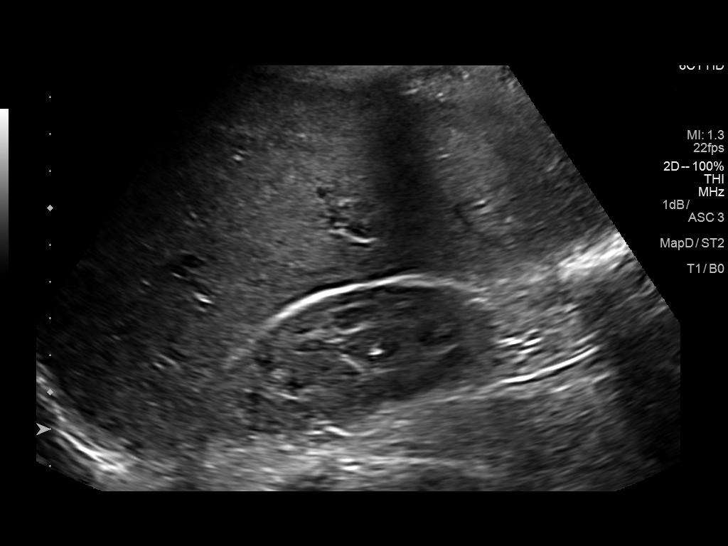
[im 8/32]
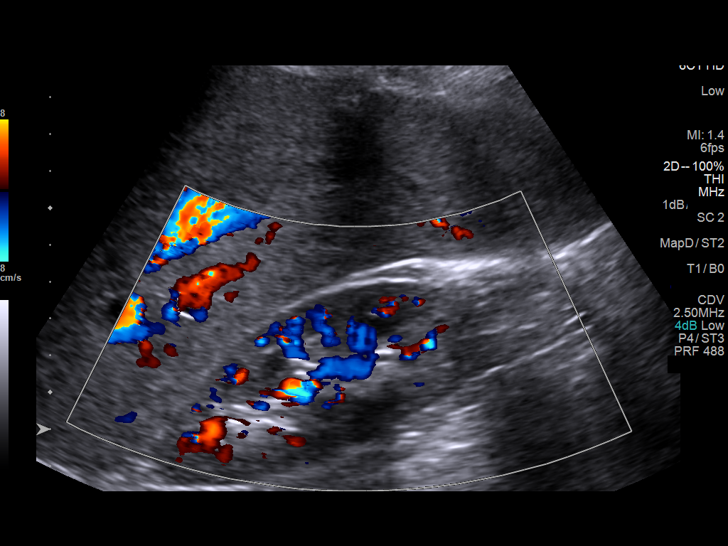
[im 11/32]
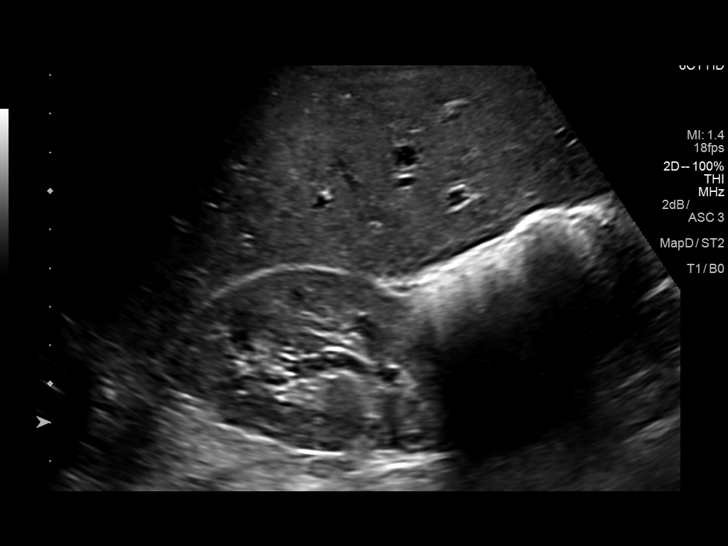
[im 12/32]
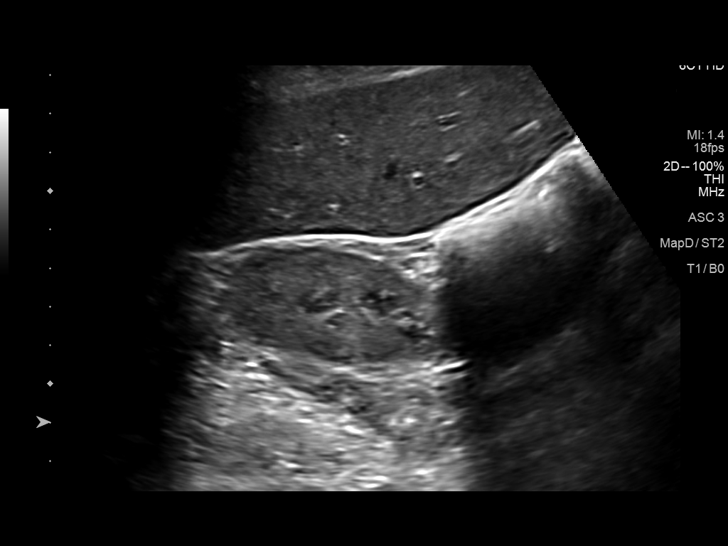
[im 15/32]
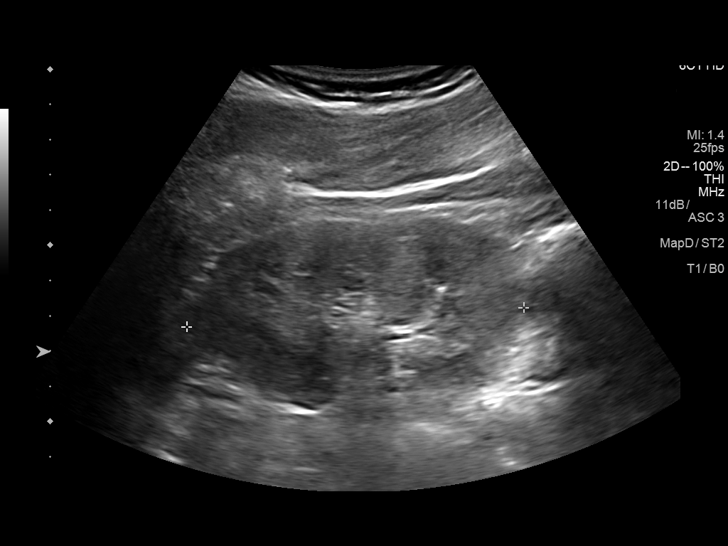
[im 17/32]
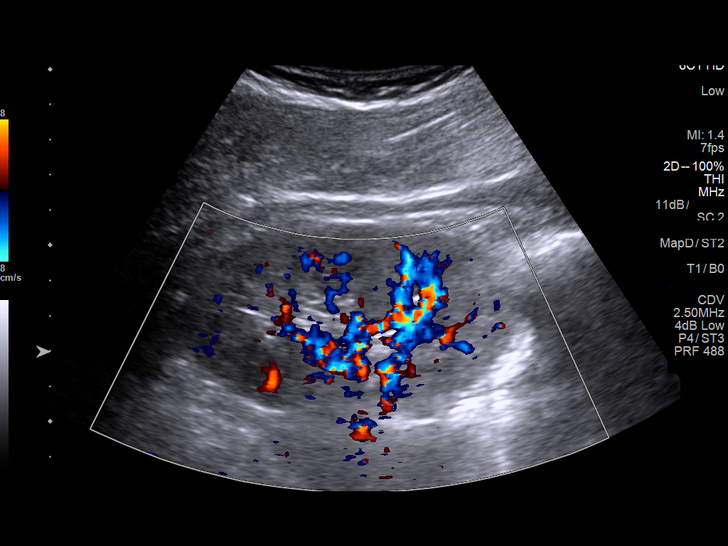
[im 20/32]
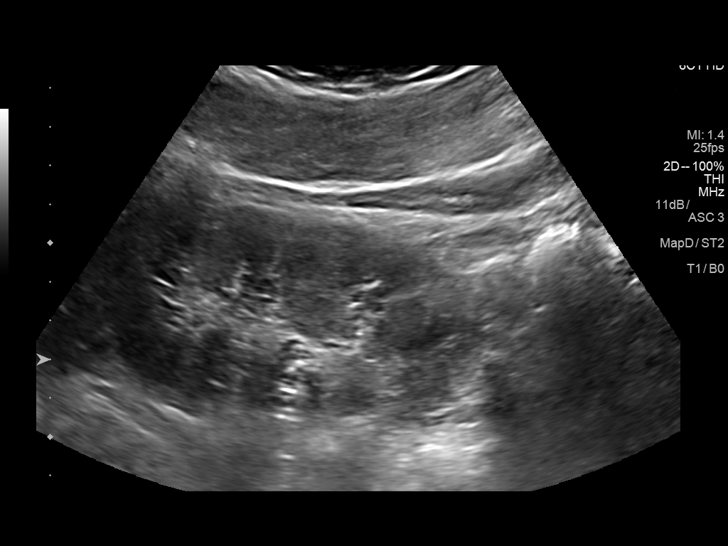
[im 21/32]
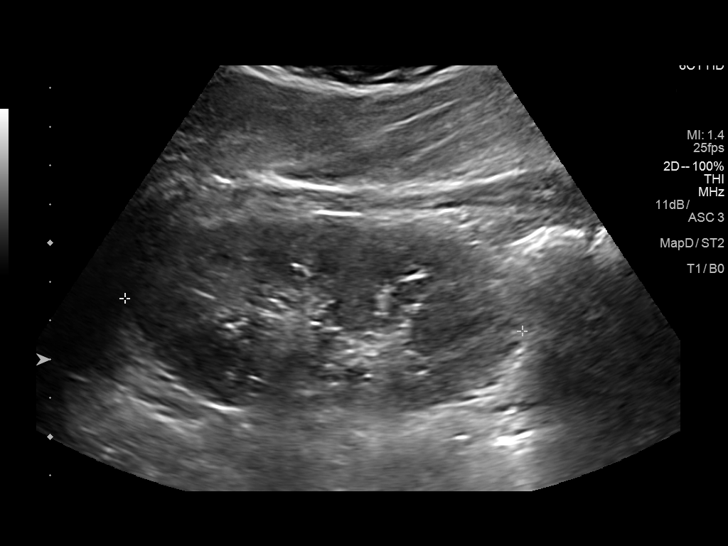
[im 24/32]
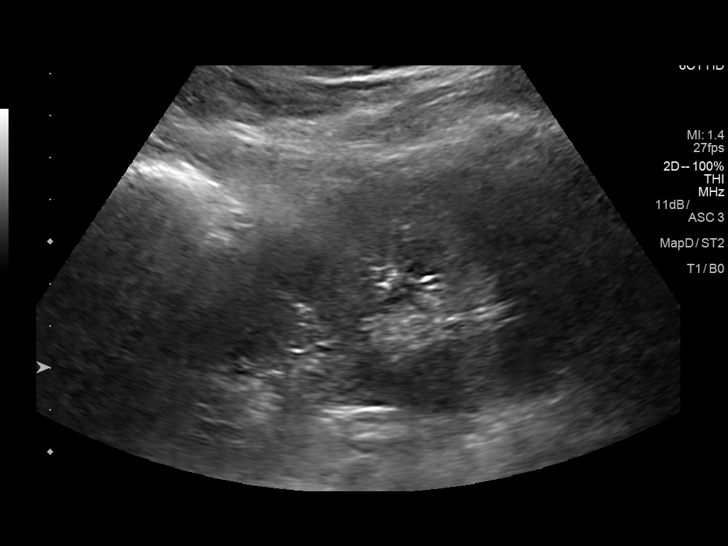
[im 26/32]
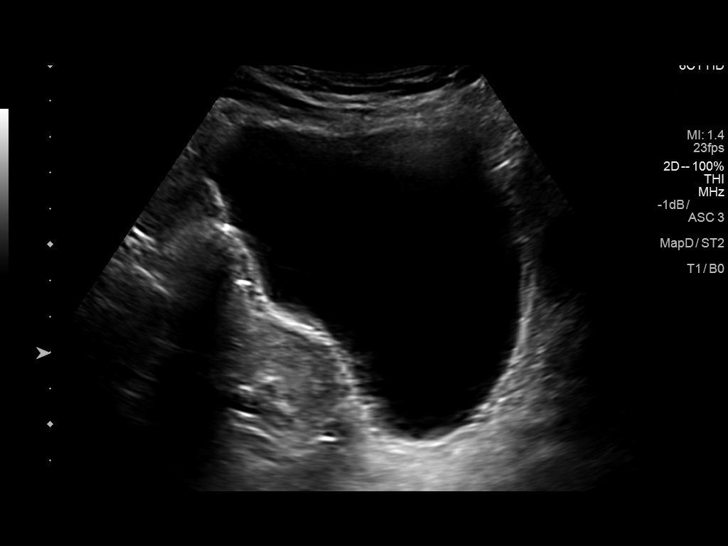
[im 29/32]
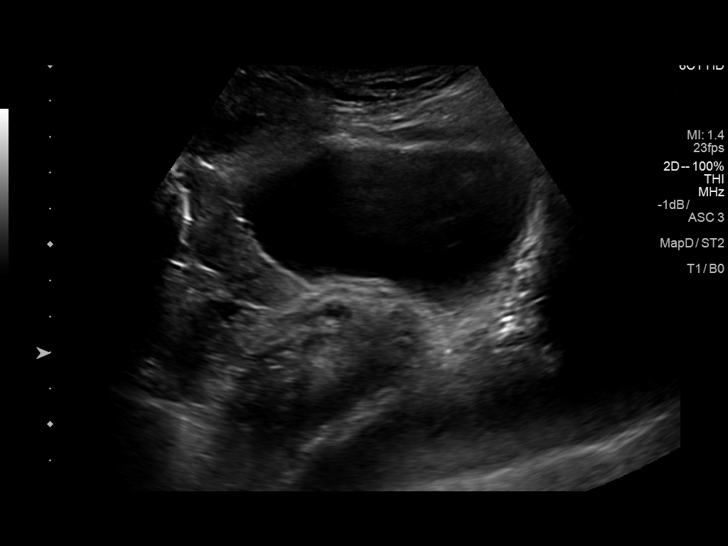
[im 32/32]
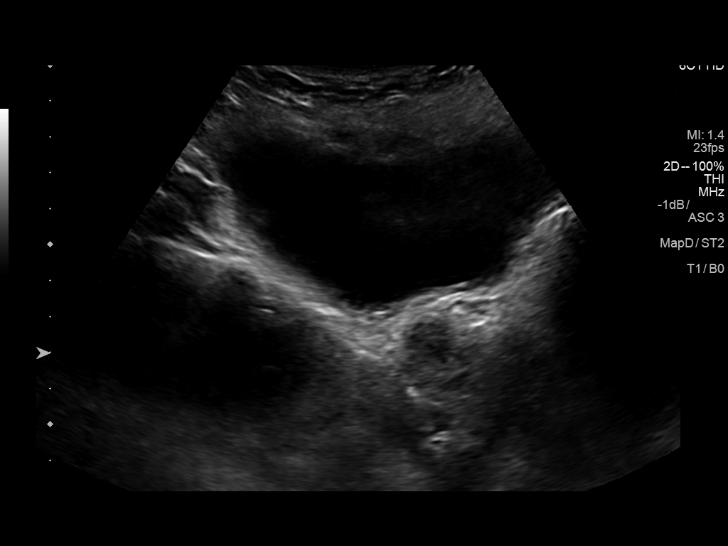

[14 of 25 positions shown; findings below may reference images not displayed]

FINDINGS: Right Kidney:

Length: 10.1 cm. Echogenicity and renal cortical thickness are
within normal limits. No mass, perinephric fluid, or hydronephrosis
visualized. No sonographically demonstrable calculus or
ureterectasis.

Left Kidney:

Length: 10.3 cm. Echogenicity and renal cortical thickness are
within normal limits. No mass, perinephric fluid, or hydronephrosis
visualized. No sonographically demonstrable calculus or
ureterectasis.

Bladder:

Appears normal for degree of bladder distention.
IMPRESSION: Study within normal limits.

## 2018-05-21 DIAGNOSIS — F411 Generalized anxiety disorder: Secondary | ICD-10-CM | POA: Diagnosis not present

## 2018-05-21 DIAGNOSIS — F332 Major depressive disorder, recurrent severe without psychotic features: Secondary | ICD-10-CM | POA: Diagnosis not present

## 2018-05-21 DIAGNOSIS — F900 Attention-deficit hyperactivity disorder, predominantly inattentive type: Secondary | ICD-10-CM | POA: Diagnosis not present

## 2018-05-29 ENCOUNTER — Encounter: Payer: Self-pay | Admitting: Gastroenterology

## 2018-06-08 ENCOUNTER — Telehealth: Payer: Self-pay | Admitting: Gastroenterology

## 2018-06-08 NOTE — Telephone Encounter (Signed)
Thanks you for letting me know

## 2018-06-12 ENCOUNTER — Encounter: Payer: BLUE CROSS/BLUE SHIELD | Admitting: Gastroenterology

## 2018-07-27 DIAGNOSIS — F902 Attention-deficit hyperactivity disorder, combined type: Secondary | ICD-10-CM | POA: Diagnosis not present

## 2018-07-27 DIAGNOSIS — Z79899 Other long term (current) drug therapy: Secondary | ICD-10-CM | POA: Diagnosis not present

## 2018-07-27 DIAGNOSIS — F419 Anxiety disorder, unspecified: Secondary | ICD-10-CM | POA: Diagnosis not present

## 2018-07-31 DIAGNOSIS — F1010 Alcohol abuse, uncomplicated: Secondary | ICD-10-CM | POA: Diagnosis not present

## 2018-07-31 DIAGNOSIS — F331 Major depressive disorder, recurrent, moderate: Secondary | ICD-10-CM | POA: Diagnosis not present

## 2018-07-31 DIAGNOSIS — F900 Attention-deficit hyperactivity disorder, predominantly inattentive type: Secondary | ICD-10-CM | POA: Diagnosis not present

## 2018-07-31 DIAGNOSIS — F411 Generalized anxiety disorder: Secondary | ICD-10-CM | POA: Diagnosis not present

## 2018-10-29 DIAGNOSIS — F411 Generalized anxiety disorder: Secondary | ICD-10-CM | POA: Diagnosis not present

## 2018-10-29 DIAGNOSIS — F900 Attention-deficit hyperactivity disorder, predominantly inattentive type: Secondary | ICD-10-CM | POA: Diagnosis not present

## 2018-10-29 DIAGNOSIS — Z79899 Other long term (current) drug therapy: Secondary | ICD-10-CM | POA: Diagnosis not present

## 2018-10-29 DIAGNOSIS — F902 Attention-deficit hyperactivity disorder, combined type: Secondary | ICD-10-CM | POA: Diagnosis not present

## 2018-10-29 DIAGNOSIS — F419 Anxiety disorder, unspecified: Secondary | ICD-10-CM | POA: Diagnosis not present

## 2018-10-29 DIAGNOSIS — F331 Major depressive disorder, recurrent, moderate: Secondary | ICD-10-CM | POA: Diagnosis not present

## 2019-02-07 DIAGNOSIS — F902 Attention-deficit hyperactivity disorder, combined type: Secondary | ICD-10-CM | POA: Diagnosis not present

## 2019-02-07 DIAGNOSIS — F419 Anxiety disorder, unspecified: Secondary | ICD-10-CM | POA: Diagnosis not present

## 2019-06-24 DIAGNOSIS — F331 Major depressive disorder, recurrent, moderate: Secondary | ICD-10-CM | POA: Insufficient documentation

## 2019-06-24 DIAGNOSIS — F1110 Opioid abuse, uncomplicated: Secondary | ICD-10-CM | POA: Insufficient documentation

## 2019-06-24 DIAGNOSIS — F1220 Cannabis dependence, uncomplicated: Secondary | ICD-10-CM | POA: Insufficient documentation

## 2019-06-24 DIAGNOSIS — F1520 Other stimulant dependence, uncomplicated: Secondary | ICD-10-CM | POA: Insufficient documentation

## 2019-06-24 DIAGNOSIS — F1420 Cocaine dependence, uncomplicated: Secondary | ICD-10-CM | POA: Insufficient documentation

## 2019-07-27 ENCOUNTER — Emergency Department (HOSPITAL_COMMUNITY)
Admission: EM | Admit: 2019-07-27 | Discharge: 2019-07-27 | Disposition: A | Discharge status: Home / Self Care | Payer: BLUE CROSS/BLUE SHIELD | Attending: Emergency Medicine | Admitting: Emergency Medicine

## 2019-07-27 ENCOUNTER — Emergency Department (HOSPITAL_COMMUNITY): Payer: BLUE CROSS/BLUE SHIELD

## 2019-07-27 ENCOUNTER — Encounter (HOSPITAL_COMMUNITY): Payer: Self-pay | Admitting: Emergency Medicine

## 2019-07-27 ENCOUNTER — Other Ambulatory Visit: Payer: Self-pay

## 2019-07-27 DIAGNOSIS — Y93I9 Activity, other involving external motion: Secondary | ICD-10-CM | POA: Insufficient documentation

## 2019-07-27 DIAGNOSIS — R0789 Other chest pain: Secondary | ICD-10-CM | POA: Diagnosis not present

## 2019-07-27 DIAGNOSIS — Y92410 Unspecified street and highway as the place of occurrence of the external cause: Secondary | ICD-10-CM | POA: Diagnosis not present

## 2019-07-27 DIAGNOSIS — Y999 Unspecified external cause status: Secondary | ICD-10-CM | POA: Diagnosis not present

## 2019-07-27 DIAGNOSIS — Z87891 Personal history of nicotine dependence: Secondary | ICD-10-CM | POA: Insufficient documentation

## 2019-07-27 DIAGNOSIS — R4182 Altered mental status, unspecified: Secondary | ICD-10-CM | POA: Diagnosis present

## 2019-07-27 LAB — URINALYSIS, ROUTINE W REFLEX MICROSCOPIC
Bilirubin Urine: NEGATIVE
Glucose, UA: NEGATIVE mg/dL
Hgb urine dipstick: NEGATIVE
Ketones, ur: NEGATIVE mg/dL
Leukocytes,Ua: NEGATIVE
Nitrite: NEGATIVE
Protein, ur: NEGATIVE mg/dL
Specific Gravity, Urine: 1.008 (ref 1.005–1.030)
pH: 8 (ref 5.0–8.0)

## 2019-07-27 LAB — COMPREHENSIVE METABOLIC PANEL
ALT: 21 U/L (ref 0–44)
AST: 21 U/L (ref 15–41)
Albumin: 3.5 g/dL (ref 3.5–5.0)
Alkaline Phosphatase: 81 U/L (ref 38–126)
Anion gap: 11 (ref 5–15)
BUN: 13 mg/dL (ref 6–20)
CO2: 26 mmol/L (ref 22–32)
Calcium: 8.8 mg/dL — ABNORMAL LOW (ref 8.9–10.3)
Chloride: 105 mmol/L (ref 98–111)
Creatinine, Ser: 0.6 mg/dL (ref 0.44–1.00)
GFR calc Af Amer: >60 mL/min (ref >60)
GFR calc non Af Amer: >60 mL/min (ref >60)
Glucose, Bld: 86 mg/dL (ref 70–99)
Potassium: 3.6 mmol/L (ref 3.5–5.1)
Sodium: 142 mmol/L (ref 135–145)
Total Bilirubin: 0.4 mg/dL (ref 0.3–1.2)
Total Protein: 6.2 g/dL — ABNORMAL LOW (ref 6.5–8.1)

## 2019-07-27 LAB — CBC
HCT: 41.6 % (ref 36.0–46.0)
Hemoglobin: 13.7 g/dL (ref 12.0–15.0)
MCH: 34.8 pg — ABNORMAL HIGH (ref 26.0–34.0)
MCHC: 32.9 g/dL (ref 30.0–36.0)
MCV: 105.6 fL — ABNORMAL HIGH (ref 80.0–100.0)
Platelets: 308 10*3/uL (ref 150–400)
RBC: 3.94 MIL/uL (ref 3.87–5.11)
RDW: 13.2 % (ref 11.5–15.5)
WBC: 8.9 10*3/uL (ref 4.0–10.5)
nRBC: 0 % (ref 0.0–0.2)

## 2019-07-27 LAB — I-STAT CHEM 8, ED
BUN: 16 mg/dL (ref 6–20)
Calcium, Ion: 1.09 mmol/L — ABNORMAL LOW (ref 1.15–1.40)
Chloride: 105 mmol/L (ref 98–111)
Creatinine, Ser: 0.9 mg/dL (ref 0.44–1.00)
Glucose, Bld: 84 mg/dL (ref 70–99)
HCT: 40 % (ref 36.0–46.0)
Hemoglobin: 13.6 g/dL (ref 12.0–15.0)
Potassium: 4.8 mmol/L (ref 3.5–5.1)
Sodium: 142 mmol/L (ref 135–145)
TCO2: 29 mmol/L (ref 22–32)

## 2019-07-27 LAB — PROTIME-INR
INR: 0.9 (ref 0.8–1.2)
Prothrombin Time: 12 seconds (ref 11.4–15.2)

## 2019-07-27 LAB — I-STAT BETA HCG BLOOD, ED (MC, WL, AP ONLY): I-stat hCG, quantitative: <5 m[IU]/mL (ref <5)

## 2019-07-27 LAB — SAMPLE TO BLOOD BANK

## 2019-07-27 LAB — CDS SEROLOGY

## 2019-07-27 LAB — LACTIC ACID, PLASMA: Lactic Acid, Venous: 2.5 mmol/L (ref 0.5–1.9)

## 2019-07-27 LAB — TROPONIN I (HIGH SENSITIVITY): Troponin I (High Sensitivity): 2 ng/L (ref <18)

## 2019-07-27 LAB — ETHANOL: Alcohol, Ethyl (B): 168 mg/dL — ABNORMAL HIGH (ref <10)

## 2019-07-27 MED ORDER — FENTANYL CITRATE (PF) 100 MCG/2ML IJ SOLN
50.0000 ug | Freq: Once | INTRAMUSCULAR | Status: AC
  Administered 2019-07-27: 50 ug via INTRAVENOUS
  Filled 2019-07-27: qty 2

## 2019-07-27 MED ORDER — OXYCODONE-ACETAMINOPHEN 5-325 MG PO TABS: 1.0000 | ORAL_TABLET | Freq: Four times a day (QID) | ORAL | 0 refills | Status: DC | PRN
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
## 2019-07-27 NOTE — Discharge Instructions (Signed)
Please take the percocets as prescribed for chest wall pain. Please make sure to take a multivitamin over the counter daily for anemia.
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
## 2019-08-09 DIAGNOSIS — F1020 Alcohol dependence, uncomplicated: Secondary | ICD-10-CM | POA: Insufficient documentation

## 2019-10-03 ENCOUNTER — Telehealth (HOSPITAL_COMMUNITY): Payer: Self-pay | Admitting: Psychiatry

## 2019-10-03 NOTE — Telephone Encounter (Signed)
D:  Pt phoned inquiring about CD-IOP.  States she just completed [facility] in [place], [place].  A:  Pt was scheduled for CCA with Fulton Reek, LCSW, LCAS for 10-08-19 @ 1pm.  Encouraged pt to verify her benefits, wear a mask and arrive 1 hr before appointment in order to complete paperwork.  R:  Pt receptive.

## 2019-10-08 ENCOUNTER — Ambulatory Visit (INDEPENDENT_AMBULATORY_CARE_PROVIDER_SITE_OTHER): Payer: BC Managed Care – PPO | Admitting: Licensed Clinical Social Worker

## 2019-10-08 ENCOUNTER — Other Ambulatory Visit: Payer: Self-pay

## 2019-10-08 DIAGNOSIS — F1020 Alcohol dependence, uncomplicated: Secondary | ICD-10-CM

## 2019-10-08 DIAGNOSIS — F1110 Opioid abuse, uncomplicated: Secondary | ICD-10-CM

## 2019-10-08 DIAGNOSIS — F1510 Other stimulant abuse, uncomplicated: Secondary | ICD-10-CM | POA: Diagnosis not present

## 2019-10-08 DIAGNOSIS — F901 Attention-deficit hyperactivity disorder, predominantly hyperactive type: Secondary | ICD-10-CM

## 2019-10-08 DIAGNOSIS — F1994 Other psychoactive substance use, unspecified with psychoactive substance-induced mood disorder: Secondary | ICD-10-CM

## 2019-10-08 DIAGNOSIS — F330 Major depressive disorder, recurrent, mild: Secondary | ICD-10-CM
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
## 2019-10-09 ENCOUNTER — Other Ambulatory Visit (HOSPITAL_COMMUNITY): Payer: BLUE CROSS/BLUE SHIELD | Attending: Psychiatry | Admitting: Medical

## 2019-10-09 ENCOUNTER — Other Ambulatory Visit: Payer: Self-pay

## 2019-10-09 DIAGNOSIS — Z91410 Personal history of adult physical and sexual abuse: Secondary | ICD-10-CM | POA: Insufficient documentation

## 2019-10-09 DIAGNOSIS — F901 Attention-deficit hyperactivity disorder, predominantly hyperactive type: Secondary | ICD-10-CM | POA: Insufficient documentation

## 2019-10-09 DIAGNOSIS — F1521 Other stimulant dependence, in remission: Secondary | ICD-10-CM | POA: Diagnosis not present

## 2019-10-09 DIAGNOSIS — F17200 Nicotine dependence, unspecified, uncomplicated: Secondary | ICD-10-CM | POA: Diagnosis not present

## 2019-10-09 DIAGNOSIS — Z62811 Personal history of psychological abuse in childhood: Secondary | ICD-10-CM

## 2019-10-09 DIAGNOSIS — F1920 Other psychoactive substance dependence, uncomplicated: Secondary | ICD-10-CM | POA: Diagnosis not present

## 2019-10-09 DIAGNOSIS — F4310 Post-traumatic stress disorder, unspecified: Secondary | ICD-10-CM | POA: Diagnosis not present

## 2019-10-09 DIAGNOSIS — F1021 Alcohol dependence, in remission: Secondary | ICD-10-CM | POA: Diagnosis not present

## 2019-10-09 DIAGNOSIS — Z811 Family history of alcohol abuse and dependence: Secondary | ICD-10-CM

## 2019-10-09 DIAGNOSIS — Z148 Genetic carrier of other disease: Secondary | ICD-10-CM
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
## 2019-10-10 ENCOUNTER — Other Ambulatory Visit: Payer: Self-pay

## 2019-10-10 ENCOUNTER — Other Ambulatory Visit (HOSPITAL_COMMUNITY): Payer: BLUE CROSS/BLUE SHIELD | Admitting: Licensed Clinical Social Worker

## 2019-10-10 DIAGNOSIS — F901 Attention-deficit hyperactivity disorder, predominantly hyperactive type: Secondary | ICD-10-CM

## 2019-10-10 DIAGNOSIS — F1021 Alcohol dependence, in remission: Secondary | ICD-10-CM

## 2019-10-10 DIAGNOSIS — F1920 Other psychoactive substance dependence, uncomplicated: Secondary | ICD-10-CM

## 2019-10-10 DIAGNOSIS — F1521 Other stimulant dependence, in remission: Secondary | ICD-10-CM

## 2019-10-11 ENCOUNTER — Encounter (HOSPITAL_COMMUNITY): Payer: Self-pay | Admitting: Medical

## 2019-10-14 ENCOUNTER — Other Ambulatory Visit (HOSPITAL_COMMUNITY): Payer: BLUE CROSS/BLUE SHIELD | Admitting: Licensed Clinical Social Worker

## 2019-10-14 ENCOUNTER — Other Ambulatory Visit: Payer: Self-pay

## 2019-10-14 DIAGNOSIS — F1920 Other psychoactive substance dependence, uncomplicated: Secondary | ICD-10-CM

## 2019-10-14 DIAGNOSIS — F1021 Alcohol dependence, in remission: Secondary | ICD-10-CM

## 2019-10-14 DIAGNOSIS — F901 Attention-deficit hyperactivity disorder, predominantly hyperactive type: Secondary | ICD-10-CM

## 2019-10-14 DIAGNOSIS — F1994 Other psychoactive substance use, unspecified with psychoactive substance-induced mood disorder: Secondary | ICD-10-CM

## 2019-10-14 DIAGNOSIS — F1521 Other stimulant dependence, in remission: Secondary | ICD-10-CM

## 2019-10-15 NOTE — Progress Notes (Signed)
    Daily Group Progress Note  Program: CD-IOP   Group Time: 1pm-2:30 Participation Level: Active Behavioral Response: Sharing and Monopolizing Type of Therapy: Process Group  Topic: Clinician met with group, assessing for SI/HI/psychosis and overall level of functioning. Clinician and group members processed recent stressors and effect on recovery. Clinician facilitated process group related to changes in family dynamics due to alcohol use, and related resentment and shame, and grief.   Group Time: 2:30pm-4pm Participation Level: Active Behavioral Response: Sharing Type of Therapy: Psycho-education Group  Topic: Clinician presented psycho-educational information on WHAT/HOW DBT skills and practicing non-judgemental view of thoughts, self, and others. Clinician presented STOPP skill. Clinician and clients engaged in [place], following with discussion on level of effectiveness in relation to mood.   Summary: Client shows progress toward goal AEB maintaining a 08/23/19 sobriety date. Client engaged in group activity, reporting it was 'interesting' but not a regular activity. Client states she is working on shifting thought process and expectations following treatment where 'I was broken down to build me up' giving the example of identifying personal bias related to feelings of entitlement.    Family Program: Family present? No   Name of family member(s): NA  UDS collected: No Results: from 10/09/19: nicotine and prescribed medications  AA/NA attended?: Yes at least daily  Sponsor?: Yes   Olegario Messier, LCSW

## 2019-10-15 NOTE — Progress Notes (Signed)
    Daily Group Progress Note  Program: CD-IOP   Group Time: 1pm-2:30pm Participation Level: Active Behavioral Response: Sharing, Monopolizing and Motivated Type of Therapy: Process Group  Topic: Clinician checked in with clients, assessing for SI/HI/psychosis and overall level of functioning. Clinician and group members discussed 'highs' and 'lows' over the weekend, including any triggers or coping skills used. Clinician and group members processed the effect of recovery vs active addition in responding to stressful events from the weekend. Clinician and group members read and responded to AA Daily Reflection and NA Just for Today, focusing on simple inventory and habits bringing satisfaction. Group members processed reviewing thoughts and feelings, highs and lows, daily rather than multiple days at a time as a way to manage coping with difficult feelings thoughts. Clients shared struggle with 'feeling feelings' can be overwhelming and it is easy to return to numbing feelings if not in recovery mindset. Clients also shared daily review as a way to be mindful of successes throughout the day.   Group Time: 2:30pm-4pm Participation Level: Active Behavioral Response: Sharing, Monopolizing and Motivated Type of Therapy: Psycho-education Group  Topic: Clinician presented psycho-educational material on the neurobiology of addiction and the effect of substances on dopamine production and the role of dopamine in motivation. Clinician reviewed with clients the CBT triangle and connection of thoughts, feelings, and behaviors. Clinician discussed with clients Automatic thoughts and addressing distorted thinking with realistic thoughts. Clinician and group members discussed the power behind re-framing word choices effecting mood about situations, such as boring vs stable, or challenging vs opportunity. Clinician inquired about self-care activity planned before next scheduled group.   Summary: Client  sobriety date remains 08/23/19. Client reports focus on trusting in her Higher power and re-framing situations to find gratitude in the moment. Client reports continued engagement in AA which she finds supportive in her recovery. Client is receptive to psycho-educational information and able to identify how it is applicable to her life.   Family Program: Family present? No   Name of family member(s): NA  UDS collected: No Results: nicotine only  AA/NA attended?: Yes; 4 since group thursday  Sponsor?: Yes   Harlon Ditty, LCSW

## 2019-10-16 ENCOUNTER — Other Ambulatory Visit (HOSPITAL_COMMUNITY): Payer: BLUE CROSS/BLUE SHIELD | Admitting: Licensed Clinical Social Worker

## 2019-10-16 ENCOUNTER — Other Ambulatory Visit: Payer: Self-pay

## 2019-10-16 DIAGNOSIS — F1021 Alcohol dependence, in remission: Secondary | ICD-10-CM

## 2019-10-16 DIAGNOSIS — F901 Attention-deficit hyperactivity disorder, predominantly hyperactive type: Secondary | ICD-10-CM

## 2019-10-16 DIAGNOSIS — Z811 Family history of alcohol abuse and dependence: Secondary | ICD-10-CM

## 2019-10-16 DIAGNOSIS — F1994 Other psychoactive substance use, unspecified with psychoactive substance-induced mood disorder: Secondary | ICD-10-CM

## 2019-10-16 DIAGNOSIS — F1521 Other stimulant dependence, in remission: Secondary | ICD-10-CM

## 2019-10-16 DIAGNOSIS — F1920 Other psychoactive substance dependence, uncomplicated: Secondary | ICD-10-CM

## 2019-10-16 NOTE — Progress Notes (Deleted)
   THERAPIST PROGRESS NOTE  Session Time: ***  Participation Level: {BHH PARTICIPATION LEVEL:22264}  Behavioral Response: {Appearance:22683}{B[facility] LEVEL OF CONSCIOUSNESS:22305}{B[facility] MOOD:22306}  Type of Therapy: {CHL AMB BH Type of Therapy:21022741}  Treatment Goals addressed: {CHL AMB BH Treatment Goals Addressed:21022754}  Interventions: {CHL AMB BH Type of Intervention:21022753}  Summary: Nicole Huang is a 49 y.o. female who presents with ***.   Suicidal/Homicidal: {B[facility] YES OR NO:22294}{yes/no/with/without intent/plan:22693}  Therapist Response: ***  Plan: Return again in *** weeks.  Diagnosis: Axis I: {psych axis 1:31909}    Axis II: {psych axis 2:31910}    Harlon Ditty, LCSW 10/16/2019

## 2019-10-17 ENCOUNTER — Other Ambulatory Visit (HOSPITAL_COMMUNITY): Payer: BLUE CROSS/BLUE SHIELD | Admitting: Licensed Clinical Social Worker

## 2019-10-17 ENCOUNTER — Other Ambulatory Visit: Payer: Self-pay

## 2019-10-17 DIAGNOSIS — F1021 Alcohol dependence, in remission: Secondary | ICD-10-CM | POA: Diagnosis not present

## 2019-10-17 DIAGNOSIS — F1521 Other stimulant dependence, in remission: Secondary | ICD-10-CM

## 2019-10-17 DIAGNOSIS — F901 Attention-deficit hyperactivity disorder, predominantly hyperactive type: Secondary | ICD-10-CM

## 2019-10-17 DIAGNOSIS — F1920 Other psychoactive substance dependence, uncomplicated: Secondary | ICD-10-CM

## 2019-10-17 DIAGNOSIS — F1994 Other psychoactive substance use, unspecified with psychoactive substance-induced mood disorder: Secondary | ICD-10-CM

## 2019-10-17 NOTE — Progress Notes (Signed)
    Daily Group Progress Note  Program: CD-IOP   Group Time: 1pm-2pm Participation Level: Active Behavioral Response: Sharing, Monopolizing and Motivated Type of Therapy: Process Group  Topic: Clinician met with clients, assessing for SI/HI/psychosis and overall level of functioning including sobriety dates, and recent triggers for cravings or changes in mood. Clinician and group members processed the effect of lack of motivation currently compared to previous points in life. Clinician challenged clients to be mindful of perception with comparisons with current 'broken brain' capacity.   Group Time: 2pm-4pm Participation Level: Active Behavioral Response: Appropriate, Sharing and Monopolizing Type of Therapy: Psycho-education Group  Topic: Clinician presented the psycho-educational topic of addressing Anger. Clinician and group members discussed anger as a secondary emotion and how/where expression of anger was learned and accepted growing up. Clinician and group members identified physical symptoms of 'small' anger through 'overwhelming' anger. Clinician encouraged group members to be mindful of body sensations to address uncomfortable feelings individually at a less intense level. Clinician and group members started recognizing anger triggers in different settings, and how anger is felt in the body at different intensities. Clinician utilized [facility] Understanding Anger: Tips for Managing Anger worksheet. Clinician presented [employer], focused on healthy communication during disagreements.   Summary: Client presents with congruent mood and affect. Client identifies that she talks excessively and believes it relates for her need to be heard and understood, which is not something she was allowed as a child and does not often feel heard at home. Client requests to practice distress tolerance skills in session and is in agreement with being 'cut off' by clinician. Client shares improving  relationship with her daughter and her thoughts of children's substance use. Client identifies she previously became complacent in her recovery and she is now re-focused on engaging mental, and spiritually. Client shares rules growing up and acceptable behaviors when someone is angry. Client reports she often acts out when she was drinking but is working on being mindful of her delivery of messages now and 'being okay with not being understood.'   Family Program: Family present? No   Name of family member(s): NA  UDS collected: Yes Results: pending  AA/NA attended?: Yes daily  Sponsor?: Yes   Olegario Messier, LCSW

## 2019-10-18 NOTE — Progress Notes (Signed)
    Daily Group Progress Note  Program: CD-IOP   Group Time: 1pm-2:30pm  Participation Level: Active  Behavioral Response: Appropriate  Type of Therapy: Process Group  Topic: Clinician checked in with clients, assessing for SI/HI/psychosis and overall level of functioning. Clinician and group members discussed 'highs' and 'lows' over the weekend, including any triggers or coping skills used. Clinician and group members processed the effect of recovery vs active addition in responding to stressful events from the week. Clinician and group members read and responded to AA Daily Reflection. Clinician provided mindfulness activity focused on grounding and being present in the moment.      Group Time: 2:30-4pm  Participation Level: Active  Behavioral Response: Appropriate  Type of Therapy: Psycho-education Group  Topic: Clinician presented psycho-educational material on Distorted thinking styles. Clinician and group members reviewed examples of each distortion. Clinician and group members identified recent cognitive distortions and practiced in session creating alternate thought patterns. Clinician and group members discussed practicing small changes daily creating long term changes in beliefs. Clinician inquired about self-care activity planned for the weekend.      Summary: Client checked in and reported a sobriety date of 08/23/19. Client actively engaged throughout group discussion. Client was able to identify personal distorted thinking. Client shows progress towards goals as evidenced by maintaining sobriety, engaging in community support groups, and having increased insight to individual thought process. Client reports that she is currently working towards humility vs. Entitlement.   Family Program: Family present? No   Name of family member(s): n/a  UDS collected: No Results: n/a  AA/NA attended?: Yes 1 meeting  Sponsor?: Yes   Harlon Ditty, LCSW

## 2019-10-21 ENCOUNTER — Encounter: Payer: Self-pay | Admitting: Medical

## 2019-10-21 ENCOUNTER — Other Ambulatory Visit: Payer: Self-pay

## 2019-10-21 ENCOUNTER — Other Ambulatory Visit (INDEPENDENT_AMBULATORY_CARE_PROVIDER_SITE_OTHER): Payer: BLUE CROSS/BLUE SHIELD | Admitting: Licensed Clinical Social Worker

## 2019-10-21 DIAGNOSIS — F1510 Other stimulant abuse, uncomplicated: Secondary | ICD-10-CM

## 2019-10-21 DIAGNOSIS — F1021 Alcohol dependence, in remission: Secondary | ICD-10-CM

## 2019-10-21 DIAGNOSIS — F1521 Other stimulant dependence, in remission: Secondary | ICD-10-CM

## 2019-10-21 DIAGNOSIS — F419 Anxiety disorder, unspecified: Secondary | ICD-10-CM

## 2019-10-21 DIAGNOSIS — F1994 Other psychoactive substance use, unspecified with psychoactive substance-induced mood disorder: Secondary | ICD-10-CM

## 2019-10-21 DIAGNOSIS — F901 Attention-deficit hyperactivity disorder, predominantly hyperactive type: Secondary | ICD-10-CM

## 2019-10-21 DIAGNOSIS — Z62811 Personal history of psychological abuse in childhood: Secondary | ICD-10-CM

## 2019-10-21 DIAGNOSIS — F341 Dysthymic disorder: Secondary | ICD-10-CM

## 2019-10-21 DIAGNOSIS — Z811 Family history of alcohol abuse and dependence: Secondary | ICD-10-CM

## 2019-10-21 DIAGNOSIS — Z148 Genetic carrier of other disease: Secondary | ICD-10-CM

## 2019-10-21 DIAGNOSIS — F1110 Opioid abuse, uncomplicated: Secondary | ICD-10-CM

## 2019-10-21 DIAGNOSIS — Z91410 Personal history of adult physical and sexual abuse: Secondary | ICD-10-CM

## 2019-10-21 DIAGNOSIS — F17200 Nicotine dependence, unspecified, uncomplicated: Secondary | ICD-10-CM

## 2019-10-21 DIAGNOSIS — F1920 Other psychoactive substance dependence, uncomplicated: Secondary | ICD-10-CM

## 2019-10-21 DIAGNOSIS — F4310 Post-traumatic stress disorder, unspecified: Secondary | ICD-10-CM

## 2019-10-21 NOTE — Progress Notes (Signed)
  Daily Group Progress Note  Program: CD-IOP   Group Time: 1-2:30pm  Participation Level: Active  Behavioral Response: Appropriate  Type of Therapy: Process Group  Topic: Clinician facilitated a check in and asked group members to identify their name, sobriety date, and how many recovery or support meetings they have attended since our last session. Clinician encouraged group members to process their recent weekend and any stressors, praises, triggers, or struggles with relapse that they may have dealt with. Clinician utilized active listening and provided praise for group members throughout the check in process.    Group Time: 2:30-4pm  Participation Level: Active  Behavioral Response: Appropriate  Type of Therapy: Psycho-education Group  Topic: Clinician presented a psychoeducational handout on values and initiated discussion on ways that personal values may influence thoughts, feelings, behaviors, or relationships. Clinician provided information on value congruency aligning with behaviors and presented an activity in which members identified values that their behaviors are both congruent and incongruent with. Members engaged in discussion on their personal values and how values have impacted their sobriety and recovery.    Summary: Client checked in and reported a sobriety date of 08/23/19 and reported having attended 5 meetings since our last group session. Client actively participated throughout group discussion and remained appropriate with responses. Client was receptive to discussion on values and was transparent in sharing the struggle for her behaviors to be congruent with her personal values while she is actively using.     Family Program: Family present? No   Name of family member(s): n/a  UDS collected: Yes Results: no substances found  AA/NA attended?: Yes  Sponsor?: Yes    Harlon Ditty, LCSW
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
## 2019-10-21 NOTE — Progress Notes (Deleted)
    Daily Group Progress Note  Program: CD-IOP   Group Time: 1-2:30pm  Participation Level: Active  Behavioral Response: Appropriate  Type of Therapy: Process Group  Topic: Clinician facilitated a check in and asked group members to identify their name, sobriety date, and how many recovery or support meetings they have attended since our last session. Clinician encouraged group members to process their recent weekend and any stressors, praises, triggers, or struggles with relapse that they may have dealt with. Clinician utilized active listening and provided praise for group members throughout the check in process.    Group Time: 2:30-4pm  Participation Level: Active  Behavioral Response: Appropriate  Type of Therapy: Psycho-education Group  Topic: Clinician presented a psychoeducational handout on values and initiated discussion on ways that personal values may influence thoughts, feelings, behaviors, or relationships. Clinician provided information on value congruency aligning with behaviors and presented an activity in which members identified values that their behaviors are both congruent and incongruent with. Members engaged in discussion on their personal values and how values have impacted their sobriety and recovery.    Summary: Client checked in and reported a sobriety date of 08/23/19 and reported having attended 5 meetings since our last group session. Client actively participated throughout group discussion and remained appropriate with responses. Client was receptive to discussion on values and was transparent in sharing the struggle for her behaviors to be congruent with her personal values while she is actively using.     Family Program: Family present? No   Name of family member(s): n/a  UDS collected: Yes Results: no substances found  AA/NA attended?: Yes  Sponsor?: Yes   Maryjean Morn, PA-C

## 2019-10-23 ENCOUNTER — Other Ambulatory Visit: Payer: Self-pay

## 2019-10-23 ENCOUNTER — Other Ambulatory Visit (HOSPITAL_COMMUNITY): Payer: BLUE CROSS/BLUE SHIELD | Admitting: Licensed Clinical Social Worker

## 2019-10-23 DIAGNOSIS — F1021 Alcohol dependence, in remission: Secondary | ICD-10-CM

## 2019-10-23 DIAGNOSIS — F1521 Other stimulant dependence, in remission: Secondary | ICD-10-CM

## 2019-10-23 DIAGNOSIS — F901 Attention-deficit hyperactivity disorder, predominantly hyperactive type: Secondary | ICD-10-CM

## 2019-10-23 DIAGNOSIS — F1994 Other psychoactive substance use, unspecified with psychoactive substance-induced mood disorder: Secondary | ICD-10-CM

## 2019-10-23 DIAGNOSIS — F1920 Other psychoactive substance dependence, uncomplicated: Secondary | ICD-10-CM

## 2019-10-24 ENCOUNTER — Other Ambulatory Visit: Payer: Self-pay

## 2019-10-24 ENCOUNTER — Encounter (HOSPITAL_COMMUNITY): Payer: Self-pay | Admitting: Licensed Clinical Social Worker

## 2019-10-24 ENCOUNTER — Other Ambulatory Visit (HOSPITAL_COMMUNITY): Payer: BLUE CROSS/BLUE SHIELD | Admitting: Licensed Clinical Social Worker

## 2019-10-24 DIAGNOSIS — F1521 Other stimulant dependence, in remission: Secondary | ICD-10-CM

## 2019-10-24 DIAGNOSIS — F1021 Alcohol dependence, in remission: Secondary | ICD-10-CM

## 2019-10-24 DIAGNOSIS — F1920 Other psychoactive substance dependence, uncomplicated: Secondary | ICD-10-CM

## 2019-10-24 DIAGNOSIS — F901 Attention-deficit hyperactivity disorder, predominantly hyperactive type: Secondary | ICD-10-CM

## 2019-10-24 DIAGNOSIS — F1994 Other psychoactive substance use, unspecified with psychoactive substance-induced mood disorder: Secondary | ICD-10-CM

## 2019-10-24 NOTE — Progress Notes (Signed)
    Daily Group Progress Note  Program: CD-IOP   Group Time: 1-2:3pm  Participation Level: Active  Behavioral Response: Appropriate  Type of Therapy: Process Group  Topic: Clinician checked in with group members, assessing for SI/HI/psychosis and overall level of functioning, including cravings, relapse, and participation in community support meetings. Clinician and group members processed 'highs' and 'lows' since last group and how challenges effected their thoughts or behaviors. Clinician and group members read and processed AA Daily Reflection related to Guilt.   Group Time: 2:30pm-4pm  Participation Level: Active  Behavioral Response: Appropriate  Type of Therapy: Psycho-education Group  Topic: Clinician provided interactive mindfulness activity 'Handful of Quiet.' Clinician presented the psycho-education topic of Guilt and Shame. Clinician presented Dewain Penning video 'Listening to Shame.' Clinician and group members processed thoughts, feelings, behaviors, and previous events related to Guilt, Toxic Guilt, and Shame. Clinician provided active listening, summarizing statements and validated client feelings. Clinician presented the option to replace 'I'm sorry' when shaming with 'Thank you for.' to avoid inappropriate guilt with gratitude. Clinician and group celebrated graduation of group member.   Summary: Client maintains sobriety date of 08/23/19, attending 1 AA meeting. Client engages well in group discussions, identifying how guilt/shame has impacted her relationship with her children. Client is receptive to re-direction on being mindful of phrases used which she previously identified as a goal for improvement. Client shows progress toward goals by maintaining sobriety, identifying and addressing distorted thinking, and applying group topics to her life. Client identified changes in energy and level of motivation ongoing which she reports as somewhat disappointing.   Family  Program: Family present? No   Name of family member(s): N/A  UDS collected: No Results: results from 10/16/19: positive for nicotine  AA/NA attended?: No  Sponsor?: No   Harlon Ditty, LCSW

## 2019-10-28 ENCOUNTER — Other Ambulatory Visit (HOSPITAL_COMMUNITY): Payer: BLUE CROSS/BLUE SHIELD | Attending: Psychiatry | Admitting: Licensed Clinical Social Worker

## 2019-10-28 ENCOUNTER — Encounter: Payer: Self-pay | Admitting: Medical

## 2019-10-28 ENCOUNTER — Other Ambulatory Visit: Payer: Self-pay

## 2019-10-28 DIAGNOSIS — F1521 Other stimulant dependence, in remission: Secondary | ICD-10-CM | POA: Diagnosis not present

## 2019-10-28 DIAGNOSIS — F419 Anxiety disorder, unspecified: Secondary | ICD-10-CM | POA: Insufficient documentation

## 2019-10-28 DIAGNOSIS — Z811 Family history of alcohol abuse and dependence: Secondary | ICD-10-CM | POA: Insufficient documentation

## 2019-10-28 DIAGNOSIS — F4310 Post-traumatic stress disorder, unspecified: Secondary | ICD-10-CM | POA: Diagnosis not present

## 2019-10-28 DIAGNOSIS — F1994 Other psychoactive substance use, unspecified with psychoactive substance-induced mood disorder: Secondary | ICD-10-CM

## 2019-10-28 DIAGNOSIS — R451 Restlessness and agitation: Secondary | ICD-10-CM | POA: Insufficient documentation

## 2019-10-28 DIAGNOSIS — Z1509 Genetic susceptibility to other malignant neoplasm: Secondary | ICD-10-CM | POA: Diagnosis not present

## 2019-10-28 DIAGNOSIS — F17210 Nicotine dependence, cigarettes, uncomplicated: Secondary | ICD-10-CM | POA: Diagnosis not present

## 2019-10-28 DIAGNOSIS — F1920 Other psychoactive substance dependence, uncomplicated: Secondary | ICD-10-CM

## 2019-10-28 DIAGNOSIS — G4700 Insomnia, unspecified: Secondary | ICD-10-CM | POA: Insufficient documentation

## 2019-10-28 DIAGNOSIS — F329 Major depressive disorder, single episode, unspecified: Secondary | ICD-10-CM | POA: Diagnosis not present

## 2019-10-28 DIAGNOSIS — F1110 Opioid abuse, uncomplicated: Secondary | ICD-10-CM

## 2019-10-28 DIAGNOSIS — F1021 Alcohol dependence, in remission: Secondary | ICD-10-CM | POA: Insufficient documentation

## 2019-10-28 DIAGNOSIS — Z79899 Other long term (current) drug therapy: Secondary | ICD-10-CM | POA: Diagnosis not present

## 2019-10-28 NOTE — Progress Notes (Signed)
    Daily Group Progress Note  Program: CD-IOP   Group Time: 1-2:30pm  Participation Level: Active  Behavioral Response: Appropriate  Type of Therapy: Process Group  Topic: Clinician met with group assessing SI/HI/psychosis and overall level of functioning, including strengths and struggles with recovery. Clinician and group members processed the recent weekend and any current concerns, stressors, or praises that they may have. Clinician and group members read and processed Daily reflection.   Group Time: 2:30-4pm  Participation Level: Active  Behavioral Response: Appropriate  Type of Therapy: Psycho-education Group  Topic: Psycho-educational group co-facilitated by fellow therapist who taught basic relaxation, breathing, stretching, and yoga skills. Facilitator encouraged group members to identify what they found helpful in today's exercise and encouraged members to engage in these practices moving forward to improve emotional regulation.    Summary: Client reported a sobriety date of 08/22/20 and reported having attended 4 support meetings since our last group session. Client processed her weekend and was receptive to feedback regarding her struggle to set healthy boundaries with her family members.   Family Program: Family present? No   Name of family member(s): n/a  UDS collected: Yes Results: UDS results pending AA/NA attended?: Yes  Sponsor?: Yes   Olegario Messier, LCSW

## 2019-10-29 NOTE — Progress Notes (Signed)
    Daily Group Progress Note  Program: CD-IOP   Group Time: 1pm-2:30pm  Participation Level: Active  Behavioral Response: Sharing  Type of Therapy: Process Group  Topic: Clinician met with group assessing SI/HI/psychosis and overall level of functioning, including strengths and struggles with recovery. Clinician and group members processed recent struggle with family dynamics and understanding of the recovery process. Clinician and group members read and processed Daily reflection.     Group Time: 2:30pm-4pm  Participation Level: Active  Behavioral Response: Appropriate  Type of Therapy: Psycho-education Group  Topic: Psycho-educational group co-facilitated by wellness director focused on self care and recovery. Facilitator and group members discussed presented materials around sleep, diet, exercise. Group members discussed a change willing to make to improve an area of self care (physical, psychological, emotional, spiritual, relationship, professional) and overall balance for supporting recovery.   Summary: Client maintains sobriety date of 08/22/20 with 1 AA meeting attended. Client maintains a focus on family relationships and forgiveness of self. Client shared the benefit of part time job for her. Client receptive to creating new behavior patterns to support recovery lifestyle   Family Program: Family present? No   Name of family member(s): NA  UDS collected: No Results: prescribed medications only  AA/NA attended?: YES  Sponsor?: Yes   Olegario Messier, LCSW

## 2019-10-30 ENCOUNTER — Other Ambulatory Visit (HOSPITAL_COMMUNITY): Payer: BLUE CROSS/BLUE SHIELD | Admitting: Licensed Clinical Social Worker

## 2019-10-30 ENCOUNTER — Other Ambulatory Visit: Payer: Self-pay

## 2019-10-30 DIAGNOSIS — F4310 Post-traumatic stress disorder, unspecified: Secondary | ICD-10-CM

## 2019-10-30 DIAGNOSIS — F1920 Other psychoactive substance dependence, uncomplicated: Secondary | ICD-10-CM

## 2019-10-30 DIAGNOSIS — F1994 Other psychoactive substance use, unspecified with psychoactive substance-induced mood disorder: Secondary | ICD-10-CM

## 2019-10-30 DIAGNOSIS — F1021 Alcohol dependence, in remission: Secondary | ICD-10-CM

## 2019-10-30 DIAGNOSIS — F329 Major depressive disorder, single episode, unspecified: Secondary | ICD-10-CM | POA: Diagnosis not present

## 2019-10-30 DIAGNOSIS — F901 Attention-deficit hyperactivity disorder, predominantly hyperactive type: Secondary | ICD-10-CM

## 2019-10-30 DIAGNOSIS — F1521 Other stimulant dependence, in remission: Secondary | ICD-10-CM

## 2019-10-31 ENCOUNTER — Other Ambulatory Visit (HOSPITAL_COMMUNITY): Payer: BLUE CROSS/BLUE SHIELD | Admitting: Licensed Clinical Social Worker

## 2019-10-31 ENCOUNTER — Other Ambulatory Visit: Payer: Self-pay

## 2019-10-31 DIAGNOSIS — F1920 Other psychoactive substance dependence, uncomplicated: Secondary | ICD-10-CM

## 2019-10-31 DIAGNOSIS — F901 Attention-deficit hyperactivity disorder, predominantly hyperactive type: Secondary | ICD-10-CM

## 2019-10-31 DIAGNOSIS — F329 Major depressive disorder, single episode, unspecified: Secondary | ICD-10-CM | POA: Diagnosis not present

## 2019-10-31 DIAGNOSIS — F1021 Alcohol dependence, in remission: Secondary | ICD-10-CM

## 2019-10-31 DIAGNOSIS — F1110 Opioid abuse, uncomplicated: Secondary | ICD-10-CM

## 2019-10-31 DIAGNOSIS — F1994 Other psychoactive substance use, unspecified with psychoactive substance-induced mood disorder: Secondary | ICD-10-CM

## 2019-10-31 DIAGNOSIS — F1521 Other stimulant dependence, in remission: Secondary | ICD-10-CM

## 2019-10-31 DIAGNOSIS — F4310 Post-traumatic stress disorder, unspecified: Secondary | ICD-10-CM

## 2019-11-01 NOTE — Progress Notes (Signed)
    Daily Group Progress Note  Program: CD-IOP   Group Time: 1pm-2:30pm Participation Level: Active Behavioral Response: Sharing Type of Therapy: Process Group  Topic: Clinician checked in with group members, assessing for SI/HI/psychosis and overall level of functioning including difficulties with cravings or relapse. Clinician and group members processed 'highs and lows' since last group. Clinician and group members processed finding moments of joy in sobriety and accepting deserving moments of happiness in recovery. Clinician and group members read AA Daily Reflection 'Filling the Void' and discussed the need for distraction skills in early recovery to help cope with overwhelming emotions in the moment   Group Time: 2:30pm-4pm Participation Level: Active Behavioral Response: Sharing Type of Therapy: Psycho-education Group  Topic: Clinician presented the topic of Resentment and forgiveness. Group discussed resentment being one of the top reasons for relapse in early recovery. Clinician and group members started activity focused on identifying a resentment and related thoughts and feelings. Clinician inquired about small self-care activity for the evening.   Summary: Client checked in with sobriety date of 08/23/19. Client shares topic from AA meeting earlier in the day about finding and enjoying moments in recovery. Client reported often using distraction skills to focus on others rather than the uncomfortable feelings of processing her own emotions. Client identified resented related to children using substances around her and putting her own sobriety in jeopardy. Client also notes some resentment around her husbands expectation of a positive mood rather than allowing client to express all emotions felt.   Family Program: Family present? No   Name of family member(s): NA  UDS collected: No Results: pending AA/NA attended?: Yes  Sponsor?: Yes   Harlon Ditty,  LCSW

## 2019-11-01 NOTE — Progress Notes (Signed)
    Daily Group Progress Note  Program: CD-IOP   Group Time: 1pm-2:30pm Participation Level: Active Behavioral Response: Appropriate Type of Therapy: Process Group Topic: Clinician met with group members, assessing for SI/HI/psychosis and overall level of functioning, including cravings or relapse. Clinician and group members processed 'highs and lows' from the previous day and any struggles in maintaining sobriety. Clinician and group members identified known and unknown triggers for relapse and family response to relapse. Clinician and group members read AA Daily Reflection focused on spirituality in recovery and the focus on 'keep coming back.' Clinician and group members read and processed NA daily meditation with a focus on "Feeling Good Isn't the Point" and sitting in uncomfortable feelings is a strength built during recovery rather than avoidance.   Group Time: 2:30pm-4pm Participation Level: Active Behavioral Response: Appropriate Type of Therapy: Psycho-education Group Topic: Psycho-education and skills group focus continuing on resentment and forgiveness. Clinician and group members completed worksheet identifying emotions linked with resentment, thoughts about self and others developed based on events supporting resentment. Clinician facilitated Empty Chair activity walking through what each group member is getting out of holding onto resentment and the changes in thought patterns resulting from forgiveness. Clinician and clients discussed acceptance vs forgiveness for situations out of control.    Summary: Client presents appropriately and is receptive to re-direction when required. Client shows progress toward goals AEB engaging in support community including attending meetings and having a sponsor. Client reports concerns with UDS being positive for cocaine and has questions about natural supplements and testing positive through physical touch due to helping her son move recently.  Client reports continuing to work on setting boundaries with her children to support her own sobriety. Client is able to complete activity related to her son and resentment/forgiveness related to drugs in her home and risking her safety and sobriety. Client notes more likely to feel acceptance and understanding of childrens' substance use rather than forgiveness.    Family present? No   Name of family member(s): NA  UDS collected: No Results: cocaine from UDS on 10/28/19  AA/NA attended?: Yes; one since last meeting  Sponsor?: Yes   Olegario Messier, LCSW

## 2019-11-04 ENCOUNTER — Other Ambulatory Visit (HOSPITAL_COMMUNITY): Payer: BLUE CROSS/BLUE SHIELD | Admitting: Licensed Clinical Social Worker

## 2019-11-04 ENCOUNTER — Other Ambulatory Visit: Payer: Self-pay

## 2019-11-04 DIAGNOSIS — F4310 Post-traumatic stress disorder, unspecified: Secondary | ICD-10-CM

## 2019-11-04 DIAGNOSIS — F1521 Other stimulant dependence, in remission: Secondary | ICD-10-CM

## 2019-11-04 DIAGNOSIS — F1994 Other psychoactive substance use, unspecified with psychoactive substance-induced mood disorder: Secondary | ICD-10-CM

## 2019-11-04 DIAGNOSIS — F329 Major depressive disorder, single episode, unspecified: Secondary | ICD-10-CM | POA: Diagnosis not present

## 2019-11-04 DIAGNOSIS — F901 Attention-deficit hyperactivity disorder, predominantly hyperactive type: Secondary | ICD-10-CM

## 2019-11-04 DIAGNOSIS — F1021 Alcohol dependence, in remission: Secondary | ICD-10-CM

## 2019-11-04 DIAGNOSIS — F1920 Other psychoactive substance dependence, uncomplicated: Secondary | ICD-10-CM

## 2019-11-05 NOTE — Progress Notes (Signed)
    Daily Group Progress Note  Program: CD-IOP   Group Time: 1pm-2:30pm Participation Level: Active Behavioral Response: Sharing, Monopolizing and Motivated Type of Therapy: Process Group  Topic: Clinician met with group assessing SI/HI/psychosis and overall level of functioning including cravings or relapse. Clinician inquired about sobriety dates, community meetings attended and what supported and challenged recovery over the weekend. Clinician processed with clients recent experiences with different family members responses to relapse, both supportive and unsupportive and related thoughts and feelings. Clinician and group members processed changing in relationship dynamics between active use and early recovery.   Group Time: 2:30pm-4pm Participation Level: Active Behavioral Response: Appropriate Type of Therapy: Psycho-education Group  Topic: Clinician presented the psycho-educational topic of Willingness vs Willfulness. Clinician and group members defined willingness and willfulness and recent moments of each related to addiction and sobriety. Clinician provided supplemental video on 'Willingness as an Antidote to Anxiety.' Clinician and group members discussed the practice of sitting in uncomfortable emotions or physical sensations and resisting the urge to act impulsively, specifically in relation to cravings in early recovery. Group members completed activity presented in video and clinician reviewed additional skill of Half-smiling and Willing hands   Clinician and group celebrated graduation of one member providing supportive feedback to support recovery.    Summary: Client checked in with a sobriety date of 08/23/19. Client reports ongoing concern about positive UDS from previous week and the effect on family relationship and legal involvement. Client shared about differences in her and husbands response to coping with overwhelming emotions during early recovery. Client shares  about struggle defining and maintaining boundaries with different children based on managing personal triggers. Client shows progress toward goal AEB continuing to attend community support meetings and attempting mindfulness skills outside of group setting.   Family Program: Family present? No   Name of family member(s): NA  UDS collected: Yes Results: pending  AA/NA attended?: Yes; 4 meetings since last group  Sponsor?: Yes   Olegario Messier, LCSW

## 2019-11-06 ENCOUNTER — Encounter (HOSPITAL_COMMUNITY): Payer: Self-pay

## 2019-11-07 ENCOUNTER — Other Ambulatory Visit: Payer: Self-pay

## 2019-11-07 ENCOUNTER — Other Ambulatory Visit (HOSPITAL_COMMUNITY): Payer: BLUE CROSS/BLUE SHIELD | Admitting: Licensed Clinical Social Worker

## 2019-11-07 DIAGNOSIS — F1521 Other stimulant dependence, in remission: Secondary | ICD-10-CM

## 2019-11-07 DIAGNOSIS — F329 Major depressive disorder, single episode, unspecified: Secondary | ICD-10-CM | POA: Diagnosis not present

## 2019-11-07 DIAGNOSIS — F4310 Post-traumatic stress disorder, unspecified: Secondary | ICD-10-CM

## 2019-11-07 DIAGNOSIS — F1920 Other psychoactive substance dependence, uncomplicated: Secondary | ICD-10-CM

## 2019-11-07 DIAGNOSIS — F1994 Other psychoactive substance use, unspecified with psychoactive substance-induced mood disorder: Secondary | ICD-10-CM

## 2019-11-07 DIAGNOSIS — F1021 Alcohol dependence, in remission: Secondary | ICD-10-CM

## 2019-11-07 DIAGNOSIS — F901 Attention-deficit hyperactivity disorder, predominantly hyperactive type: Secondary | ICD-10-CM

## 2019-11-07 NOTE — Progress Notes (Signed)
   THERAPIST PROGRESS NOTE  Session Time: 4:10pm-4:55pm  Participation Level: Active  Behavioral Response: Well GroomedAlertNA  Type of Therapy: Individual Therapy  Treatment Goals addressed: Communication and increase coping skills  Interventions: Motivational Interviewing, CBT, solution focused  Summary: Client presented alert and oriented for this session immediately following CD-IOP today. Client identified the group as extremely helpful for her sobriety in addition to regularly attending AA and meeting with her sponsor. Client was open and willing to engage in the timeline activity and discussed significant events such as her experience with her family of origin and identified her history with substance abuse and multiple treatment encounters beginning in college. Client discussed current resentment within her household which includes her spouse and children. Client acknowledged having difficulty establishing boundaries with her family and reported improving boundaries as an identified treatment goal. [name] agreed to meet again in 2 weeks and to identify 3 treatment goals for individual therapy during our next meeting together.   Suicidal/Homicidal: Nowithout intent/plan  Therapist Response: Clinician met with Nicole Huang for an individual therapy session. Clinician initiated check in and inquired as to Nicole Huang's experience in CD-IOP today. Clinician introduced a timeline activity and prompted Nicole Huang to identify significant events throughout her life to provide insight into her experiences, treatment, and relationship history. Clinician utilized open ended questions and summarizations to prompt Nicole Huang to increase vulnerability throughout session. Clinician validated Nicole Huang's experiences and statements in describing her personal history, struggles, and goals. Clinician asked Nicole Huang to identify 3 specific goals that she would like to work towards throughout individual therapy.   Plan: Return again in 2  weeks.  Diagnosis: Axis I: Substance Induced Mood Disorder        Renee Harder, MSW, LCSW 10/31/2019

## 2019-11-11 ENCOUNTER — Other Ambulatory Visit: Payer: Self-pay

## 2019-11-11 ENCOUNTER — Encounter (HOSPITAL_COMMUNITY): Payer: Self-pay | Admitting: Medical

## 2019-11-11 ENCOUNTER — Other Ambulatory Visit (INDEPENDENT_AMBULATORY_CARE_PROVIDER_SITE_OTHER): Payer: BLUE CROSS/BLUE SHIELD | Admitting: Licensed Clinical Social Worker

## 2019-11-11 DIAGNOSIS — Z811 Family history of alcohol abuse and dependence: Secondary | ICD-10-CM

## 2019-11-11 DIAGNOSIS — F419 Anxiety disorder, unspecified: Secondary | ICD-10-CM

## 2019-11-11 DIAGNOSIS — Z62811 Personal history of psychological abuse in childhood: Secondary | ICD-10-CM

## 2019-11-11 DIAGNOSIS — F329 Major depressive disorder, single episode, unspecified: Secondary | ICD-10-CM | POA: Diagnosis not present

## 2019-11-11 DIAGNOSIS — F1021 Alcohol dependence, in remission: Secondary | ICD-10-CM

## 2019-11-11 DIAGNOSIS — Z148 Genetic carrier of other disease: Secondary | ICD-10-CM

## 2019-11-11 DIAGNOSIS — F341 Dysthymic disorder: Secondary | ICD-10-CM

## 2019-11-11 DIAGNOSIS — F17200 Nicotine dependence, unspecified, uncomplicated: Secondary | ICD-10-CM

## 2019-11-11 DIAGNOSIS — F1521 Other stimulant dependence, in remission: Secondary | ICD-10-CM

## 2019-11-11 DIAGNOSIS — F4310 Post-traumatic stress disorder, unspecified: Secondary | ICD-10-CM

## 2019-11-11 DIAGNOSIS — F1920 Other psychoactive substance dependence, uncomplicated: Secondary | ICD-10-CM

## 2019-11-11 MED ORDER — BUPROPION HCL ER (XL) 300 MG PO TB24: 300.0000 mg | ORAL_TABLET | ORAL | 0 refills | Status: AC
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
## 2019-11-12 NOTE — Progress Notes (Signed)
    Daily Group Progress Note  Program: CD-IOP   Group Time: 1pm-2:30pm Participation Level: Active Behavioral Response: Appropriate Type of Therapy: Process Group  Topic: Clinician and group members checked in with assessment of SI/HI/psychosis and overall level of functioning, including difficulty managing recovery and use of skills and support system. Clinician and group members read and processed Reflections of the day. Clinician and group members processed thoughts and feelings related to access to [facility] and SUD treatment and barriers clients experienced seeking treatment.     Group Time: 2:30pm-4pm Participation Level: Active Behavioral Response: Appropriate Type of Therapy: Psycho-education Group  Topic: Clinician presented psycho-educational topic of Love Languages. Clinician provided group members with screening and implementation of different types of ways to interact based on partners language. Clinician and group members discussed the importance in healthy communication in a relationship. Clinician provided clients with list of ways to incorporate love languages with self-care and self-compassion. Clinician provided clients with brief list of coping skills and when each might be helpful or not helpful based on examples.   Summary: Client engaged appropriately in group discussions. Client was able to identified the differences in love languages between self and spouse and conflicts due to this. Client shows progress toward goals AEB maintaining sobriety, engaging in [facility], and implementing mindful moments of self care. Client is working on using assertive communication to get her needs met and hold boundaries.   Family Program: Family present? No   Name of family member(s): NA  UDS collected: No Results: UDS from 11/04/19 negative for substances; positive for prescribed medications  AA/NA attended?: Yes Sponsor?: Yes   Olegario Messier, LCSW

## 2019-11-12 NOTE — Progress Notes (Signed)
    Daily Group Progress Note  Program: CD-IOP    Group Time: 1pm-2:30pm  Participation Level: Active Behavioral Response: Appropriate Type of Therapy: Process Group Topic: Clinician checked in with group members, assessing for SI/HI/psychosis and overall level of functioning. Clinician and group members processed moments of success and barriers to maintaining recovery since previous group. Clinician and group members read and processed Daily Reflection and how it reflects on current place in recovery. Clinician and group members completed Loving Kindness Meditation and processed ease or difficulty in showing kindness to self or others.   Group Time: 2:30pm-4pm Participation Level: Active Behavioral Response: Appropriate Type of Therapy: Psycho-education Group Topic: Clinician presented the topic of Kristin Neff's self-compassion and skills focused on supporting positive view of self, avoiding excessive self-criticism, and finding common humanity using gratitude and self-soothing to cope with difficult situations with limited control. Clinician and group members reviewed differences in self-esteem vs self-compassion. Group members discussed common humanity and suffering in the context of engaging with AA groups. Clinician presented self-esteem journal encouraging clients to use this as a tool to identify positive moments and moments of gratitude throughout the week.   Summary: Client maintains initial sobriety date. Client shows progress toward goals AEB maintaining sobriety and engaging in community meetings including finding a sponsor. Client is receptive to the idea of self compassion due to residual feelings of not being deserving due to previous behaviors. Client was receptive to meditation reporting trying to add more mindfulness into her daily life as a regular practice to maintain stable mood.   Family Program: Family present? No   Name of family member(s): NA  UDS collected: No    AA/NA attended?: Yes  Sponsor?: Yes   Harlon Ditty, LCSW

## 2019-11-13 ENCOUNTER — Encounter (HOSPITAL_COMMUNITY): Payer: Self-pay

## 2019-11-14 ENCOUNTER — Other Ambulatory Visit: Payer: Self-pay

## 2019-11-14 ENCOUNTER — Other Ambulatory Visit (HOSPITAL_COMMUNITY): Payer: BLUE CROSS/BLUE SHIELD | Admitting: Licensed Clinical Social Worker

## 2019-11-14 DIAGNOSIS — F4310 Post-traumatic stress disorder, unspecified: Secondary | ICD-10-CM

## 2019-11-14 DIAGNOSIS — F1920 Other psychoactive substance dependence, uncomplicated: Secondary | ICD-10-CM

## 2019-11-14 DIAGNOSIS — F329 Major depressive disorder, single episode, unspecified: Secondary | ICD-10-CM | POA: Diagnosis not present

## 2019-11-14 DIAGNOSIS — F1021 Alcohol dependence, in remission: Secondary | ICD-10-CM

## 2019-11-14 DIAGNOSIS — F901 Attention-deficit hyperactivity disorder, predominantly hyperactive type: Secondary | ICD-10-CM

## 2019-11-14 DIAGNOSIS — F1521 Other stimulant dependence, in remission: Secondary | ICD-10-CM

## 2019-11-18 ENCOUNTER — Other Ambulatory Visit (HOSPITAL_COMMUNITY): Payer: BLUE CROSS/BLUE SHIELD | Admitting: Licensed Clinical Social Worker

## 2019-11-18 ENCOUNTER — Encounter: Payer: Self-pay | Admitting: Medical

## 2019-11-18 ENCOUNTER — Other Ambulatory Visit: Payer: Self-pay

## 2019-11-18 DIAGNOSIS — F329 Major depressive disorder, single episode, unspecified: Secondary | ICD-10-CM | POA: Diagnosis not present

## 2019-11-18 DIAGNOSIS — F1021 Alcohol dependence, in remission: Secondary | ICD-10-CM

## 2019-11-18 DIAGNOSIS — F1521 Other stimulant dependence, in remission: Secondary | ICD-10-CM

## 2019-11-18 DIAGNOSIS — F1920 Other psychoactive substance dependence, uncomplicated: Secondary | ICD-10-CM

## 2019-11-18 DIAGNOSIS — F901 Attention-deficit hyperactivity disorder, predominantly hyperactive type: Secondary | ICD-10-CM

## 2019-11-18 DIAGNOSIS — F4310 Post-traumatic stress disorder, unspecified: Secondary | ICD-10-CM

## 2019-11-18 NOTE — Progress Notes (Signed)
  Virtual Visit via Video Note  I connected with Nicole Huang on 11/14/2019 at  1:00 PM EST by a video enabled telemedicine application and verified that I am speaking with the correct person using two identifiers.   I discussed the limitations of evaluation and management by telemedicine and the availability of in person appointments. The patient expressed understanding and agreed to proceed.  I discussed the assessment and treatment plan with the patient. The patient was provided an opportunity to ask questions and all were answered. The patient agreed with the plan and demonstrated an understanding of the instructions.   The patient was advised to call back or seek an in-person evaluation if the symptoms worsen or if the condition fails to improve as anticipated.  I provided 180 minutes of non-face-to-face time during this encounter.   Olegario Messier, LCSW    Daily Group Progress Note  Program: CD-IOP  Group Time: 1pm-2pm Participation Level: Active Behavioral Response: Appropriate Type of Therapy: Process Group   Topic: Clinician checked in with group members, assessing for SI/HI/psychosis and overall level of functioning including relapse and barriers to recovery. Clinician and group members discussed highlights and struggles since last group related to maintaining sobriety including identified triggers and responses. Clinician and group members processed Daily Reflection and  personal meeting to current work in recovery. Clinician provided mindfulness activity.    Group Time: 2pm-4pm Participation Level: Active Behavioral Response: Appropriate Type of Therapy: Psycho-education Group  Topic: Clinician and group members reviewed accomplishments and needs assessment, including physical, emotional, cognitive, and social needs. Clinician and group members discussed what it looks like to have needs met, and what it looks like when their personal needs are not met. Clinician  facilitated discussion on self-awareness of feelings, unmet needs, and fears related to asking for help. Clinician facilitated discussion on meeting needs in healthy vs unhealthy way. Clinician and group members discussed breaking unhealthy patterns of getting needs met and provided examples of helpful and unhelpful needs instructions.   Summary: Client presents and appropriately engaged via Web[employer] in her home. Client maintains sobriety date of 08/22/20 and continues daily AA meetings. Client is able to identify some personal needs and how getting needs met can affect her recovery. Client is able to verbalize need for assertive communication for boundary setting is an ongoing goal. Client shared asking for help is difficult because it was less acceptable in her home growing up to verbalize needs and she did not want to appear weak. Client acknowledges personal co-dependent tendencies which get in the way of getting personal needs met.   Family Program: Family present? No   Name of family member(s): NA  UDS collected: No   AA/NA attended?: Yes  Sponsor?: Yes   Olegario Messier, LCSW

## 2019-11-19 NOTE — Progress Notes (Signed)
    Daily Group Progress Note  Program: CD-IOP   Group Time: 1pm-2:30pm Participation Level: Active Behavioral Response: Appropriate Type of Therapy: Process Group    Topic: Clinician met with clients, assessing for SI/HI/psychosis and overall level of functioning including attendance of recovery meetings and relapse or challenges to sobriety. Clinician and group members discussed highs and lows from previous days and reflection on topic from previous day. Clinician and group members read Daily Reflection and clinician facilitated discussion on topic related to recovery.    Group Time: 2:30pm-4pm Participation Level: Active  Behavioral Response: Appropriate Type of Therapy: Psycho-education Group  Topic: Clinician provided psycho-educational group on PAWS. Clinician provided supplemental video with discussion on common PAWS symptoms and skills to help with management. Clinician utilized Post Acute Withdrawal (PAW) Self-Evaluation developed by Patrina Levering. Clinician and group members discussed the importance of being aware and tracking symptoms during recovery. Clinician provided supplemental video of information related to PAWS, common triggers, and relaxation techniques for management of symptoms. Clinician provided exercises for mindfulness as an alternative to meditations.  Summary: Client presents with continued sobriety date of 08/23/19 and continued engagement with AA. Client shared with group taking pain medications following oral surgery and what is considered a relapse. Client processed continued concerns related to family dynamics and ongoing substance use with her children. Client shows progress toward goals AEB maintaining sobriety and engaging in community support groups. Client continues to need support with implementing and maintaining boundaries.   Family Program: Family present? No   Name of family member(s): NA; family therapy recommended with husband  UDS collected:  Yes Results: pending  AA/NA attended?: Yes; 4 mtgs since last meeting  Sponsor?: Yes   Olegario Messier, LCSW

## 2019-11-20 ENCOUNTER — Other Ambulatory Visit: Payer: Self-pay

## 2019-11-20 ENCOUNTER — Other Ambulatory Visit (INDEPENDENT_AMBULATORY_CARE_PROVIDER_SITE_OTHER): Payer: BLUE CROSS/BLUE SHIELD | Admitting: Licensed Clinical Social Worker

## 2019-11-20 DIAGNOSIS — F901 Attention-deficit hyperactivity disorder, predominantly hyperactive type: Secondary | ICD-10-CM

## 2019-11-20 DIAGNOSIS — F4310 Post-traumatic stress disorder, unspecified: Secondary | ICD-10-CM | POA: Diagnosis not present

## 2019-11-20 DIAGNOSIS — F1521 Other stimulant dependence, in remission: Secondary | ICD-10-CM

## 2019-11-20 DIAGNOSIS — F1021 Alcohol dependence, in remission: Secondary | ICD-10-CM | POA: Diagnosis not present

## 2019-11-20 DIAGNOSIS — F1920 Other psychoactive substance dependence, uncomplicated: Secondary | ICD-10-CM

## 2019-11-20 DIAGNOSIS — F1994 Other psychoactive substance use, unspecified with psychoactive substance-induced mood disorder: Secondary | ICD-10-CM

## 2019-11-20 DIAGNOSIS — F329 Major depressive disorder, single episode, unspecified: Secondary | ICD-10-CM | POA: Diagnosis not present

## 2019-11-21 ENCOUNTER — Encounter (HOSPITAL_COMMUNITY): Payer: Self-pay

## 2019-11-25 ENCOUNTER — Other Ambulatory Visit: Payer: Self-pay

## 2019-11-25 ENCOUNTER — Other Ambulatory Visit (HOSPITAL_COMMUNITY): Payer: BLUE CROSS/BLUE SHIELD | Attending: Psychiatry | Admitting: Licensed Clinical Social Worker

## 2019-11-25 DIAGNOSIS — Z91410 Personal history of adult physical and sexual abuse: Secondary | ICD-10-CM | POA: Insufficient documentation

## 2019-11-25 DIAGNOSIS — F17210 Nicotine dependence, cigarettes, uncomplicated: Secondary | ICD-10-CM | POA: Insufficient documentation

## 2019-11-25 DIAGNOSIS — F1521 Other stimulant dependence, in remission: Secondary | ICD-10-CM | POA: Diagnosis not present

## 2019-11-25 DIAGNOSIS — F1021 Alcohol dependence, in remission: Secondary | ICD-10-CM | POA: Insufficient documentation

## 2019-11-25 DIAGNOSIS — F4310 Post-traumatic stress disorder, unspecified: Secondary | ICD-10-CM | POA: Diagnosis not present

## 2019-11-25 DIAGNOSIS — F901 Attention-deficit hyperactivity disorder, predominantly hyperactive type: Secondary | ICD-10-CM

## 2019-11-25 DIAGNOSIS — F1920 Other psychoactive substance dependence, uncomplicated: Secondary | ICD-10-CM | POA: Insufficient documentation

## 2019-11-25 DIAGNOSIS — F329 Major depressive disorder, single episode, unspecified: Secondary | ICD-10-CM | POA: Diagnosis present

## 2019-11-25 DIAGNOSIS — F1994 Other psychoactive substance use, unspecified with psychoactive substance-induced mood disorder: Secondary | ICD-10-CM

## 2019-11-25 DIAGNOSIS — Z148 Genetic carrier of other disease: Secondary | ICD-10-CM | POA: Insufficient documentation

## 2019-11-25 DIAGNOSIS — Z79899 Other long term (current) drug therapy: Secondary | ICD-10-CM | POA: Insufficient documentation

## 2019-11-26 ENCOUNTER — Telehealth (HOSPITAL_COMMUNITY): Payer: Self-pay | Admitting: Licensed Clinical Social Worker

## 2019-11-26 NOTE — Progress Notes (Signed)
    Daily Group Progress Note  Program: CD-IOP   Group Time: 1pm-2:30pm psycho education; 2:30pm-4pm process group  Participation Level: Active  Behavioral Response: Rationalizing and Resistant  Type of Therapy: Group Therapy  Topic:  The purpose of this group is to utilize CBT and DBT skills to increase use of healthy coping skills to eliminate substance use and support recovery.  Psycho-educational session co-facilitated by [facility] focused on use of yoga to promote the use of mindfulness as a skill for recovery. Clinician and group members participated in session skills presented. Clinician praised client use of engagement in activity and challenged clients to use this skill and body awareness to help monitor symptoms of stress. Clinician and group members reviewed and discussed crisis planning, including identifying triggers, distraction sills, support systems and resources. UDS completed on all clients.  Process session focused on recent stressors and barriers to recovery. Clinician checked in with group members, assessing for SI/HI/psychosis and overall level of functioning including relapse and cravings. Clinician and group members discussed challenges and successes with family dynamics, engagement with community support systems, and skills used to address interactions, including assertive communication or distraction skills. Clinician challenged group members to continue enforcing boundaries despite level of comfort or discomfort. Clinician inquired self care activity to complete prior to next group session.   Summary: Client presented to group with aporpriate dress, anxious and irritable mood, weight rationalizing and guarded responses to challenges and suggestions from group members and clinicians. Client verbalized frustrations from family events over the weekend however continued to rationalize behaviors and appeared to struggle accepting responsibility for own behaviors in  interactions. Client verbalized concern for upcoming court date causing tension in addition to some blaming behaviors against family which she 'had' to respond to. Client shows progress toward goals AEB maintaining sobriety and engaging in community support groups however continues to struggle with boundaries.   Family Program: Family present? No   Name of family member(s): NA  UDS collected: Yes Results: pending  AA/NA attended?: Yes 6 since meeting on Wednesday due to missing meeting thursday  Sponsor?: Yes; report desire for new sponsor   Harlon Ditty, LCSW

## 2019-11-26 NOTE — Progress Notes (Signed)
    Daily Group Progress Note  Program: CD-IOP   Group Time: 1pm-2:30pm Participation Level: Active Behavioral Response: Appropriate Type of Therapy: Process Group   Topic: Clinician checked in with group members, assessing for SI/HI/psychosis and overall level of functioning. Clinician and group members processed events of the weekend, including highlights and moments of struggle. Clinician and group members processed related thoughts and feelings and coping skills used to address. Clinician and group members read and processed Daily Reflection and relation to current stage of recovery.     Group Time: 2:30pm-4pm Participation Level: Active Behavioral Response: Appropriate Type of Therapy: Psycho-education Group   Topic: Clinician provided guided relaxation skill in session. Clinician presented psycho-educational information on 'Addictive Personalities' using supporting material from The Substance Abuse and Recovery Workbook. Clinician and group members discussed personality traits relating to self-esteem, relationship dynamics, life skills, and excitement. Clinician and group members discussed changes in risk taking in recovery vs active addiction. Clinician and group members review Clear mind from previous session vs addict or clean mind. Clinician inquired a self-care activity planned before next group.   Summary: Client repored sobriety date of 08/22/20. Client acknowledged use of prescribed pain medication and endorses taking as prescribed. Client reported related to several traits of addictive personality. Client noted answers were very different today than when she was in active addiction. Client continued to struggle with family dynamics and their effect on her sobriety and responses to others. Client continued to struggle with rationalizing behaviors.   Family Program: Family present? No   Name of family member(s): NA  UDS collected: No Results: UDS from 11/18/19 positive for  opioids per prescription following dental surgery. This was client reported prior to UDS  AA/NA attended?: Yes 2  Sponsor?: Yes   Harlon Ditty, LCSW

## 2019-11-26 NOTE — Telephone Encounter (Signed)
Attempted contact with client to confirm sending tx verification to her attorney (ROI obtained). Informed client that she cannot be seen tomorrow at 4pm and will need to contact the clinic to schedule an appt.

## 2019-11-27 ENCOUNTER — Other Ambulatory Visit (HOSPITAL_COMMUNITY): Payer: Self-pay | Admitting: Medical

## 2019-11-27 ENCOUNTER — Encounter (HOSPITAL_COMMUNITY): Payer: Self-pay | Admitting: Medical

## 2019-11-27 ENCOUNTER — Other Ambulatory Visit: Payer: Self-pay

## 2019-11-27 ENCOUNTER — Other Ambulatory Visit (INDEPENDENT_AMBULATORY_CARE_PROVIDER_SITE_OTHER): Payer: BLUE CROSS/BLUE SHIELD | Admitting: Licensed Clinical Social Worker

## 2019-11-27 DIAGNOSIS — F419 Anxiety disorder, unspecified: Secondary | ICD-10-CM

## 2019-11-27 DIAGNOSIS — Z91410 Personal history of adult physical and sexual abuse: Secondary | ICD-10-CM

## 2019-11-27 DIAGNOSIS — F341 Dysthymic disorder: Secondary | ICD-10-CM

## 2019-11-27 DIAGNOSIS — F1920 Other psychoactive substance dependence, uncomplicated: Secondary | ICD-10-CM

## 2019-11-27 DIAGNOSIS — Z811 Family history of alcohol abuse and dependence: Secondary | ICD-10-CM

## 2019-11-27 DIAGNOSIS — F1110 Opioid abuse, uncomplicated: Secondary | ICD-10-CM

## 2019-11-27 DIAGNOSIS — Z148 Genetic carrier of other disease: Secondary | ICD-10-CM

## 2019-11-27 DIAGNOSIS — F1521 Other stimulant dependence, in remission: Secondary | ICD-10-CM

## 2019-11-27 DIAGNOSIS — Z62811 Personal history of psychological abuse in childhood: Secondary | ICD-10-CM

## 2019-11-27 DIAGNOSIS — F4310 Post-traumatic stress disorder, unspecified: Secondary | ICD-10-CM

## 2019-11-27 DIAGNOSIS — F329 Major depressive disorder, single episode, unspecified: Secondary | ICD-10-CM | POA: Diagnosis not present

## 2019-11-27 DIAGNOSIS — F1021 Alcohol dependence, in remission: Secondary | ICD-10-CM

## 2019-11-27 DIAGNOSIS — F901 Attention-deficit hyperactivity disorder, predominantly hyperactive type: Secondary | ICD-10-CM

## 2019-11-27 DIAGNOSIS — F17200 Nicotine dependence, unspecified, uncomplicated: Secondary | ICD-10-CM

## 2019-11-27 NOTE — Progress Notes (Unsigned)
   [facility] Follow-up Outpatient CDIOP Date:   Admission Date:  Sobriety date:  Subjective:   HPI   Review of Systems: Psychiatric: Agitation: No Hallucination: No Depressed Mood: Yes much better Insomnia: No Hypersomnia: No Altered Concentration: No Feels Worthless: No Grandiose Ideas: No Belief In Special Powers: No New/Increased Substance Abuse: No Compulsions: No  Neurologic: Headache: No Seizure: No Paresthesias: No  Current Medications:   Vital Signs  Mental Status Examination  Appearance: Alert: Yes Attention: good  Cooperative: Yes Eye Contact: Good Speech: Clear and coherent Psychomotor Activity: Normal Memory/Concentration: Normal/intact Oriented: person, place, time/date and situation Mood: Euthymic Affect: Appropriate and Congruent Thought Processes and Associations: Coherent and Intact Fund of Knowledge: Good Thought Content: WDL Insight: Good Judgement: Good  UDS:  Diagnosis:   Assessment:  Treatment Plan: Maryjean Morn, PA-CPatient ID: Nicole Huang, female   DOB: 12-12-1970, 49 y.o.   MRN: [id number]

## 2019-11-27 NOTE — Addendum Note (Signed)
Addended by: Fulton Reek A on: 11/27/2019 12:01 PM   Modules accepted: Level of Service

## 2019-11-28 ENCOUNTER — Other Ambulatory Visit (HOSPITAL_COMMUNITY): Payer: BLUE CROSS/BLUE SHIELD | Admitting: Licensed Clinical Social Worker

## 2019-11-28 ENCOUNTER — Other Ambulatory Visit: Payer: Self-pay

## 2019-11-28 DIAGNOSIS — F329 Major depressive disorder, single episode, unspecified: Secondary | ICD-10-CM | POA: Diagnosis not present

## 2019-11-28 DIAGNOSIS — F331 Major depressive disorder, recurrent, moderate: Secondary | ICD-10-CM

## 2019-11-28 DIAGNOSIS — F1521 Other stimulant dependence, in remission: Secondary | ICD-10-CM

## 2019-11-28 DIAGNOSIS — F1920 Other psychoactive substance dependence, uncomplicated: Secondary | ICD-10-CM

## 2019-11-28 DIAGNOSIS — F1021 Alcohol dependence, in remission: Secondary | ICD-10-CM

## 2019-11-28 NOTE — Addendum Note (Signed)
Addended by: Francine Graven on: 11/28/2019 12:51 PM   Modules accepted: Level of Service

## 2019-11-28 NOTE — Progress Notes (Signed)
   THERAPIST PROGRESS NOTE  Session Time: 4:00pm-4:45pm  Participation Level: Active  Behavioral Response: Well GroomedAlertAnxious  Type of Therapy: Individual Therapy  Treatment Goals addressed: Coping, Communication  Interventions: CBT, Motivational Interviewing, Supportive  Summary: Client presented for session in person immediately after CDIOP. Client provided an update on current stressors regarding family conflict specifically related to her husband. Client provided historical information related to their relationship and discussed thoughts and feelings related their current conflict. Client identified feeling resentful and as though she is unable to identify her feelings. Client was proactive in attempting to relate to her husband's perspective however was also able to verbalize that she is working towards radical acceptance as she feels his patterns may never change. Client discussed how this impacts her sobriety and past relapses. Client was receptive to feedback regarding her desire to make amends with others and neglecting her own needs while doing so.   Suicidal/Homicidal: Nowithout intent/plan  Therapist Response: Clinician joined client for a session and assessed for any changes in mood, SI/HI, or psychosis. Clinician prompted client to process her current family dynamic as she has identified this as a stressors in previous sessions. Clinician attempted to challenge client's thoughts related to her reported guilt/shame related to past relapses. Clinician validated client's feelings and encouraged her to work towards meeting her own needs to ensure she maintains sobriety and continues to work towards a more positive quality of life as well as repairing relationship with her husband.    Plan: Return again in 2 weeks.  Diagnosis: Axis I: Alcohol use disorder, severe, in early remission (HCC)        Francine Graven, MSW, LCSW 11/28/2019

## 2019-12-02 ENCOUNTER — Encounter (HOSPITAL_COMMUNITY): Payer: Self-pay | Admitting: Medical

## 2019-12-02 ENCOUNTER — Other Ambulatory Visit (HOSPITAL_COMMUNITY): Payer: BLUE CROSS/BLUE SHIELD | Admitting: Licensed Clinical Social Worker

## 2019-12-02 ENCOUNTER — Other Ambulatory Visit: Payer: Self-pay

## 2019-12-02 DIAGNOSIS — F329 Major depressive disorder, single episode, unspecified: Secondary | ICD-10-CM | POA: Diagnosis not present

## 2019-12-02 DIAGNOSIS — F331 Major depressive disorder, recurrent, moderate: Secondary | ICD-10-CM

## 2019-12-02 DIAGNOSIS — F1020 Alcohol dependence, uncomplicated: Secondary | ICD-10-CM

## 2019-12-02 DIAGNOSIS — F1520 Other stimulant dependence, uncomplicated: Secondary | ICD-10-CM
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
## 2019-12-03 NOTE — Progress Notes (Signed)
  Daily Group Progress Note  Program: CD-IOP   Group Time: 1pm-2:30pm Participation Level: Active Behavioral Response: Appropriate Type of Therapy: Process Group  Topic: Clinician checked in with group members, assessing for SI/HI/psychosis and overall level of functioning including cravings and substance use. Clinician and group members processed recent 'highs' and 'lows' identified triggers and coping skills which were used to address. Clinician and group members reviewed Daily Reflection and how the topic relates to them individually. Daily Reflection focused on surrender and Just For Today meditation focused on Learning to Love Ourselves.' Clinician and group members identified moments of gratitude.   Group Time: 2:30pm-4pm Participation Level: Active Behavioral Response: Appropriate Type of Therapy: Psycho-education Group  Topic: Clinician provided psychoeducational material related to Emotional Freedom Techniques, or Tapping. Clinician demonstrated points for clients and provided options for focusing. Clinician explained the goal of the Tapping procedure is to accept a feeling fully, acknowledge the thoughts the feeling creates, and create more helpful or realistic thoughts which in turn decrease the intensity of an unhelpful feeling. Clinician facilitated discussion with group members on the importance of fully identifying and feeling an emotion, uncomfortable or not. Clinician and group members participated in a guided tapping exercise to address cravings. Clinician and group members shared additional self soothing and mindfulness activities. Group members checked out with self care activity planned to support recovery.    Summary: Client checked in with successes and moments that did not go as well over the weekend with family interactions and attempts to set boundaries. Client shows progress toward goals AEB identifying wanted boundaries however needs growth with maintaining  boundaries long term and not being reactive to family's responses. Client was receptive to tapping activity, stating she is working on being more mindful of interactions with others.   Family Program: Family present? No   Name of family member(s): NA  UDS collected: No Results: nicotine found in previous UDS only  AA/NA attended?: Yes; 3 meetings since last group on Thursday.  Sponsor?: Yes   Harlon Ditty, LCSW

## 2019-12-04 ENCOUNTER — Other Ambulatory Visit (INDEPENDENT_AMBULATORY_CARE_PROVIDER_SITE_OTHER): Payer: BLUE CROSS/BLUE SHIELD | Admitting: Licensed Clinical Social Worker

## 2019-12-04 ENCOUNTER — Other Ambulatory Visit: Payer: Self-pay

## 2019-12-04 ENCOUNTER — Ambulatory Visit (INDEPENDENT_AMBULATORY_CARE_PROVIDER_SITE_OTHER): Payer: BLUE CROSS/BLUE SHIELD | Admitting: Licensed Clinical Social Worker

## 2019-12-04 DIAGNOSIS — F1521 Other stimulant dependence, in remission: Secondary | ICD-10-CM | POA: Diagnosis not present

## 2019-12-04 DIAGNOSIS — F4310 Post-traumatic stress disorder, unspecified: Secondary | ICD-10-CM | POA: Diagnosis not present

## 2019-12-04 DIAGNOSIS — F17200 Nicotine dependence, unspecified, uncomplicated: Secondary | ICD-10-CM

## 2019-12-04 DIAGNOSIS — F1021 Alcohol dependence, in remission: Secondary | ICD-10-CM

## 2019-12-04 DIAGNOSIS — Z62811 Personal history of psychological abuse in childhood: Secondary | ICD-10-CM

## 2019-12-04 DIAGNOSIS — F1920 Other psychoactive substance dependence, uncomplicated: Secondary | ICD-10-CM

## 2019-12-04 DIAGNOSIS — F1994 Other psychoactive substance use, unspecified with psychoactive substance-induced mood disorder: Secondary | ICD-10-CM

## 2019-12-04 DIAGNOSIS — Z811 Family history of alcohol abuse and dependence: Secondary | ICD-10-CM

## 2019-12-04 DIAGNOSIS — F329 Major depressive disorder, single episode, unspecified: Secondary | ICD-10-CM | POA: Diagnosis not present

## 2019-12-04 DIAGNOSIS — F901 Attention-deficit hyperactivity disorder, predominantly hyperactive type: Secondary | ICD-10-CM

## 2019-12-04 DIAGNOSIS — F341 Dysthymic disorder: Secondary | ICD-10-CM

## 2019-12-04 DIAGNOSIS — Z148 Genetic carrier of other disease: Secondary | ICD-10-CM

## 2019-12-04 DIAGNOSIS — F419 Anxiety disorder, unspecified: Secondary | ICD-10-CM

## 2019-12-04 DIAGNOSIS — Z91410 Personal history of adult physical and sexual abuse: Secondary | ICD-10-CM

## 2019-12-04 NOTE — Progress Notes (Signed)
   THERAPIST PROGRESS NOTE  Session Time: 4:10pm-4:40pm  Participation Level: Active  Behavioral Response: Well GroomedAlertEuthymic  Type of Therapy: Individual Therapy  Treatment Goals addressed: Coping  Interventions: CBT, Motivational Interviewing  Summary: Client joined session and was alert and oriented x5. Client provided an update regarding mood or any current stressors. Client discussed her recent implementation of boundaries with others specifically family and discussed thoughts, feelings that are related. Client discussed areas of growth through this program and goals she wants to continue working on through outpatient therapy. Client denied SI/HI/psychosis.  Suicidal/Homicidal: Nowithout intent/plan  Therapist Response: Clinician met with client and assessed for mood, recent stressors, SI/HI/psychosis. Clinician praised client's willingness to begin utilizing boundaries and prompted processing on this skill. Clinician validated client's feelings and further engaged in discussion on client's hopes in future treatment and maintaining sobriety.   Plan: Client to d/c CDIOP 12/05/19.  Diagnosis: Axis I: Alcohol use disorder, sever, in early remission ([place])       Renee Harder, MSW, LCSW 12/04/2019

## 2019-12-05 ENCOUNTER — Other Ambulatory Visit (HOSPITAL_COMMUNITY): Payer: BLUE CROSS/BLUE SHIELD | Admitting: Licensed Clinical Social Worker

## 2019-12-05 ENCOUNTER — Encounter (HOSPITAL_COMMUNITY): Payer: Self-pay | Admitting: Medical

## 2019-12-05 ENCOUNTER — Other Ambulatory Visit: Payer: Self-pay

## 2019-12-05 DIAGNOSIS — F901 Attention-deficit hyperactivity disorder, predominantly hyperactive type: Secondary | ICD-10-CM

## 2019-12-05 DIAGNOSIS — F1994 Other psychoactive substance use, unspecified with psychoactive substance-induced mood disorder: Secondary | ICD-10-CM

## 2019-12-05 DIAGNOSIS — F329 Major depressive disorder, single episode, unspecified: Secondary | ICD-10-CM | POA: Diagnosis not present

## 2019-12-05 DIAGNOSIS — F1020 Alcohol dependence, uncomplicated: Secondary | ICD-10-CM

## 2019-12-05 DIAGNOSIS — F331 Major depressive disorder, recurrent, moderate: Secondary | ICD-10-CM

## 2019-12-05 DIAGNOSIS — F1520 Other stimulant dependence, uncomplicated: Secondary | ICD-10-CM

## 2019-12-08 NOTE — Progress Notes (Signed)
    Daily Group Progress Note  Program: CD-IOP  Group Time: 1pm-2pm Participation Level: Active Behavioral Response: Appropriate Type of Therapy: Psycho-education Group  Topic: Psychoeducational presentation co-facilitated by Berna Spare. from [facility]. STI and HIV psychoeducation was provided to clients. clients were provided with time to ask questions and discuss local resources.   Group Time: 2pm-4pm Participation Level: Active Behavioral Response: Appropriate Type of Therapy: Process Group Topic: Process group:  Clinician checked in with clients, assessing for SI/HI/psychosis and overall level of functioning including cravings, barriers to sobriety, and highlights when skills were successfully used. Clinician and group processed 'Pain to Power' vocabulary and the importance of phrasing on feelings and intentions related to behaviors. Clinician allowed provided summarizing statements and praised clients identifying personal changes in perspective of situations. Clinician inquired about self care activity to be completed before next group.   Summary: Client was receptive to psycho-ed group reporting useful information for her sons. Client is receptive to challenging thoughts and rephrasing. Client is able to process distress with family dynamics and is open to feedback from group. Client maintains sobriety however struggles with frequent triggers and is resistant to changes in living situations.   Family Program: Family present? No   Name of family member(s): NA  UDS collected: No Results: nicotine  AA/NA attended?: Yes  Sponsor?: Yes   Harlon Ditty, LCSW

## 2019-12-08 NOTE — Progress Notes (Signed)
    Daily Group Progress Note  Program: CD-IOP   Group Time: 1pm-2:30pm Participation Level: Active Behavioral Response: Appropriate Type of Therapy: Process Group Topic:   Clinician checked in with group members, assessing for SI/HI/psychosis and overall level of functioning including triggers, cravings, or relapse. Clinician and group members reviewed sobriety date, community meetings attended, and positive interactions as well as interactions which could have gone better and how they were addressed. Clinician and group members read and discussed AA Daily Reflection and NA Just For Today with focus on humility, trusting the process of recovery, and acceptance of all parts of self. Clinician and group members processed the grief related to loss of lifestyle prior to recovery and thoughts and feelings related to being social around people who are able to drink responsibly.    Group Time: 2:30pm-4pm Participation Level: Active Behavioral Response: Appropriate Type of Therapy: Psycho-education Group Topic: Clinician presented Happy Brain: How to Overcome Our Neural Predispositions to Suffering by Dr. Verna Czech.  Clinician presented psycho-educational information on automatic thinking and neural predispositions. Group members discussed negative adaptations including mind wandering, negativity bias. Educational information also included skills to improve moments of happiness by focusing on gratitude skills, non-judgmental stance, and reframing challenges to focus on compassion, acceptance, meaning, and forgiveness. Clinician and group members discussed recent life stressors and how these skills could be applied. Clinician presented the topic of mindfulness in conjunction with psycho-educational materials focused vs wandering modes of thinking. Clinician and group members discussed core features of mindfulness and barriers for implementation.   Summary: Client maintains sobriety date with daily  AA engagement. Client shared thoughts on humility and self acceptance over ego as a priority in current recovery. Client was receptive to psycho-education, reporting currently using gratitude to help improve mood on difficult days. Client shared work being a mindful activity for her. Client show progress toward goals AEB maintaining sobriety, engaging in AA, and increasing self awareness of feelings. Client identified activities to practicing focusing her mind which she believed would also help with her ADHD.   Family Program: Family present? No   Name of family member(s): NA  UDS collected: No Results: UDS from 11/25/19 positive for prescribed medications and nicotine only.  AA/NA attended?: Yes daily  Sponsor?: Yes   Harlon Ditty, LCSW

## 2019-12-08 NOTE — Progress Notes (Signed)
    Daily Group Progress Note  Program: CD-IOP   Group Time: 1pm-2:30pm Participation Level: Active Behavioral Response: Appropriate Type of Therapy: Process Group  Topic: Clinician checked in with group members, assessing for SI/HI/psychosis and overall level of functioning. Clinician processed with client recent successes and any barriers to recovery. Clinician and group members read Daily Reflection and JFT and processed making decisions based on fear. Clinician provided time for group members to process recovery and uncertainty about specific higher power.  Client processed views on higher power and how identification or lack of identification has affected early recovery and their engagement in community support groups.   Group Time: 2:30pm-4pm Participation Level: Active Behavioral Response: Appropriate Type of Therapy: Psycho-education Group Topic: Clinician presented the Dorise Bullion of Addiction and Recovery and spoke with clients about 'milestones' notice. Clinician presented psychoeducation on Gorski's Developmental Model of Recovery and Relapse. Clinician also presented common early warning signs for substance abuse and mental health relapse including attitude/thinking changes, mood/emotional changes, behavioral changes, and changes in daily living or physical health. Clinician inquired self-care plan for the evening.   Summary:  Client shared spirituality's effect on recovery over the past several months. Client identified 'tasks' she felt completed on recovery curve. Client was able to identify pattern of triggers in relapse and identify possible upcoming moments of struggle with recovery, specifically in thought and behavior changes around family. Client shows progress toward goals AEB maintaining sobriety, attending meetings, identifying stuck points.   Family Program: Family present? No   Name of family member(s): NA  UDS collected: No Results: non from previous  UDS  AA/NA attended?: Yes; daily  Sponsor?: Yes   Harlon Ditty, LCSW

## 2019-12-08 NOTE — Progress Notes (Signed)
    Daily Group Progress Note  Program: CD-IOP   Group Time: 1pm-2:30pm Participation Level: Active Behavioral Response: Appropriate, Sharing and Assertive Type of Therapy: Process Group Topic: Clinician checked in with group members, assessing for SI/HI/psychosis and overall level of functioning. Clinician processed with client recent successes and any barriers to recovery. Clinician and group members read Daily Reflection and JFT and processed spirituality vs religion in recovery, feeling safe in community support groups, and the effect of projection and distorted thinking on group engagement. Clinician praised group members reflecting and challenging each other's points of view.  Group Time: 2:30pm-4pm Participation Level: Active Behavioral Response: Appropriate and Sharing Type of Therapy: Psycho-education Group  Topic: Clinician provided Handful of Quiet Pebble meditation as a mindfulness practice in session. Clinician presentedCoping with Cravings and Urges handout from Co-Occurring Disorders Treatment Workbook. Clinician and group members reviewed Sober Self-Care list and personalized skills based on individual behaviors. Clinician presented the specific DBT skill of OppositeAction. Clinician inquired about self care activity to support recovery to be completed over the weekend.Clinician and group celebrated graduation of 3 members.   Summary: Client presented and engaged appropriately, utilizing assertive communication skills to provide feedback to other group members. Client is able to identify progress toward treatment goals, and risks to ongoing recovery. Client shows progress by maintaining sobriety from 08/22/19, finding sponsor and engaging in [place], and beginning to implement boundaries with family members.Client successfully completes CDIOP this day with follow up at [facility] for outpatient therapy and medication management.   Family Program: Family present? No   Name  of family member(s): NA  UDS collected: No Results: no substances found from previous UDS; prescribed medications only  AA/NA attended?: Yes  Sponsor?: Yes   Harlon Ditty, LCSW

## 2019-12-09 ENCOUNTER — Encounter (HOSPITAL_COMMUNITY): Payer: BLUE CROSS/BLUE SHIELD

## 2019-12-18 ENCOUNTER — Ambulatory Visit (HOSPITAL_COMMUNITY): Payer: BLUE CROSS/BLUE SHIELD | Admitting: Licensed Clinical Social Worker
